# Patient Record
Sex: Male | Born: 1965
Health system: Southern US, Community
[De-identification: ages and names within clinical notes are randomized; demographics above are authoritative.]

## PROBLEM LIST (undated history)

## (undated) DIAGNOSIS — E785 Hyperlipidemia, unspecified: Secondary | ICD-10-CM

## (undated) HISTORY — DX: Hyperlipidemia, unspecified: E78.5

## (undated) HISTORY — PX: HERNIA REPAIR: SHX51

## (undated) HISTORY — PX: APPENDECTOMY: SHX54

---

## 1998-09-13 ENCOUNTER — Emergency Department (HOSPITAL_COMMUNITY): Admission: EM | Admit: 1998-09-13 | Discharge: 1998-09-13 | Payer: Self-pay | Admitting: Emergency Medicine

## 1998-09-13 ENCOUNTER — Encounter: Payer: Self-pay | Admitting: Emergency Medicine

## 2004-03-30 ENCOUNTER — Encounter: Admission: RE | Admit: 2004-03-30 | Discharge: 2004-03-30 | Payer: Self-pay | Admitting: General Surgery

## 2005-06-12 ENCOUNTER — Observation Stay (HOSPITAL_COMMUNITY): Admission: EM | Admit: 2005-06-12 | Discharge: 2005-06-13 | Payer: Self-pay | Admitting: Emergency Medicine

## 2005-06-12 ENCOUNTER — Encounter (INDEPENDENT_AMBULATORY_CARE_PROVIDER_SITE_OTHER): Payer: Self-pay | Admitting: Specialist

## 2005-06-19 ENCOUNTER — Emergency Department (HOSPITAL_COMMUNITY): Admission: EM | Admit: 2005-06-19 | Discharge: 2005-06-19 | Payer: Self-pay | Admitting: Emergency Medicine

## 2007-06-04 ENCOUNTER — Encounter: Admission: RE | Admit: 2007-06-04 | Discharge: 2007-06-04 | Payer: Self-pay | Admitting: Family Medicine

## 2007-06-10 ENCOUNTER — Encounter: Admission: RE | Admit: 2007-06-10 | Discharge: 2007-06-10 | Payer: Self-pay | Admitting: Family Medicine

## 2010-07-01 NOTE — H&P (Signed)
NAME:  Frank Rice, Frank Rice NO.:  [id number]   MEDICAL RECORD NO.:  [id number]          PATIENT TYPE:  INP   LOCATION:  1603                         FACILITY:  [facility]   PHYSICIAN:  Lebron Conners, M.D.   DATE OF BIRTH:  [birth year]   DATE OF ADMISSION:  06/12/2005  DATE OF DISCHARGE:                                HISTORY & PHYSICAL   CHIEF COMPLAINT:  This job was dictated twice.   HISTORY OF PRESENT ILLNESS:  The patient is a generally healthy, 45-year-  old, white male with a couple of days history of central abdominal pain  which became less severe and is localized in the right lower quadrant.  No  fever or elevation of the white count.  CT scan of the abdomen demonstrated  an enlarged appendix consistent with acute appendicitis.  The patient was  sent to the hospital for my attention and probable appendectomy.   PAST MEDICAL HISTORY:  1.  Bilateral inguinal hernia repair.  2.  Vasectomy.   MEDICATIONS:  He takes no medicines.   ALLERGIES:  No known medical allergies.   SOCIAL HISTORY:  He drinks alcoholic beverages in moderation.  He has  stopped smoking a few days prior.   FAMILY HISTORY:  Childhood illnesses unremarkable.   REVIEW OF SYSTEMS:  A 15-point review of systems unremarkable.   PHYSICAL EXAMINATION:  GENERAL:  In no acute distress.  Mental status  normal.  VITAL SIGNS:  Vital signs normal per nurse.  HEENT:  Unremarkable with no enlargement of thyroid.  No neck mass.  Normal  mucosa.  CHEST:  Clear to auscultation and percussion.  HEART:  Rate and rhythm normal.  No murmur or gallop.  ABDOMEN:  Generally soft, but a little bit tender in all four quadrants with  marked right lower quadrant tenderness.  No mass, no hernia.  Bilateral  inguinal scars.  GENITALIA:  Normal.  EXTREMITIES:  No edema.  Good pulses.  No skin lesions seen.  NEUROLOGICAL:  Grossly normal.   IMPRESSION:  Acute appendicitis.   PLAN:  Immediate appendectomy by  laparoscopic approach.  The patient  consents to the procedure.      Lebron Conners, M.D.  Electronically Signed     WB/MEDQ  D:  06/13/2005  T:  06/13/2005  Job:  [id number]

## 2010-07-01 NOTE — Op Note (Signed)
NAME:  MURRELL, DOME NO.:  [id number]   MEDICAL RECORD NO.:  [id number]          PATIENT TYPE:  INP   LOCATION:  6045                         FACILITY:  [facility]   PHYSICIAN:  Lebron Conners, M.D.   DATE OF BIRTH:  11-Dec-1965   DATE OF PROCEDURE:  06/12/2005  DATE OF DISCHARGE:                                 OPERATIVE REPORT   PRE AND POSTOPERATIVE DIAGNOSIS:  Acute appendicitis.   OPERATION:  Laparoscopic appendectomy.   SURGEON:  Lebron Conners, M.D.   ANESTHESIA:  General and local.   SPECIMEN:  Appendix.   BLOOD LOSS:  Minimal.   COMPLICATIONS:  None.   PROCEDURE:  After the patient was monitored and asleep and had a Foley  catheter and routine preparation and draping of the abdomen,  I made a 2 cm  vertical incision just below the umbilicus and incised the midline fascia,  then the posterior sheath since I was slightly to the right of the midline  and then bluntly opened the peritoneum.  I placed 0 Vicryl pursestring  suture and secured a Hassan cannula and inflated the abdomen with CO2.  I  put in the camera and had a good view and saw that the intestines looked  normal but the appendix was slightly swollen and inflamed.  I put in a 5 mm  port in the right upper quadrant and a 11 mm port in the lower midline a few  centimeters below the first port under direct view, noting no injuries of  viscera as they were inserted.  I then grasped the appendix and elevated it  and dissected the adhesions and fat away until I was able to staple across  it with the cutting endoscopic stapler.  It did not quite completely divide  all the attachment to the cecum so I stapled a second load and it appeared  to make a nice secure amputation of the appendix.  I placed the appendix in  a plastic pouch to be removed through the umbilical incision at the end of  the operation.  I irrigated the area briefly and removed the irrigant and  saw that hemostasis was excellent.   After removing the appendix from the  umbilical incision, I  tied the  pursestring suture then removed the right upper quadrant port under direct  view and saw no bleeding from the abdominal wall.  I then allowed the carbon  dioxide to escape and removed the hypogastric port as well.  I closed all  skin incisions with intracuticular 4-0 Vicryl and Steri-Strips.  The patient  tolerated the operation well.      Lebron Conners, M.D.  Electronically Signed     WB/MEDQ  D:  06/12/2005  T:  06/13/2005  Job:  [id number]   cc:   Brett Canales A. Cleta Alberts, M.D.  Fax: [phone]

## 2010-07-01 NOTE — H&P (Signed)
NAME:  Frank Rice, Frank Rice NO.:  [id number]   MEDICAL RECORD NO.:  [id number]          PATIENT TYPE:  INP   LOCATION:  1610                         FACILITY:  [facility]   PHYSICIAN:  Lebron Conners, M.D.   DATE OF BIRTH:  [birth year]   DATE OF ADMISSION:  06/12/2005  DATE OF DISCHARGE:                                HISTORY & PHYSICAL   CHIEF COMPLAINT:  Abdominal pain.   PRESENT ILLNESS:  The patient is a previously healthy, 45 year old, white  male with 2-day history of rather severe central and diffuse abdominal pain  which then became less severe, but localized to the right lower quadrant.  He was seen at by Dr. Cleta Alberts at [facility] and found to have no  fever, but marked right lower quadrant abdominal tenderness.  A CT scan was  done which was interpreted as consistent with acute appendicitis.  He is  brought to the hospital for appendectomy.   PAST MEDICAL HISTORY:  The patient's health is excellent.   MEDICATIONS:  He is on no medicines.   ALLERGIES:  No known drug allergies.   PAST SURGICAL HISTORY:  1.  Bilateral inguinal hernia repair.  2.  Vasectomy.  He had no problems with anesthesia.   SOCIAL HISTORY:  He smokes about half pack of cigarettes a day and is trying  to quit.  He drinks alcoholic beverages occasionally and moderately.   FAMILY HISTORY:  Childhood illnesses and 15-point review of systems are all  unremarkable.   PHYSICAL EXAMINATION:  VITAL SIGNS:  Temperature and vital signs per nursing  staff.  The mental status is normal.  HEENT:  Head, neck, eyes, ears, nose, mouth and throat are unremarkable with  no neck mass or thyromegaly.  CHEST:  Clear to auscultation.  HEART:  Regular rhythm normal.  No murmur or gallop appreciated.  ABDOMEN:  Soft with a few bowel sounds markedly tender with rebound  tenderness in the right lower quadrant.  No mass.  No hernia.  Bilateral  groin incisions which are well-healed.  GENITALIA:  Normal.  EXTREMITIES:  Normal.  No edema or lesions seen.  SKIN:  No lesions noted.  LYMPH NODES:  Lymph nodes not enlarged in the groin.   IMPRESSION:  Acute appendicitis.   PLAN:  Laparoscopic appendectomy immediately.      Lebron Conners, M.D.  Electronically Signed     WB/MEDQ  D:  06/12/2005  T:  06/12/2005  Job:  [id number]

## 2011-05-10 ENCOUNTER — Ambulatory Visit (INDEPENDENT_AMBULATORY_CARE_PROVIDER_SITE_OTHER): Payer: BC Managed Care – PPO | Admitting: Emergency Medicine

## 2011-05-10 DIAGNOSIS — H9190 Unspecified hearing loss, unspecified ear: Secondary | ICD-10-CM

## 2011-05-10 DIAGNOSIS — H6980 Other specified disorders of Eustachian tube, unspecified ear: Secondary | ICD-10-CM

## 2011-05-10 MED ORDER — FLUTICASONE PROPIONATE 50 MCG/ACT NA SUSP: 2.0000 | Freq: Every day | NASAL | Status: DC

## 2011-05-10 MED ORDER — AZELASTINE HCL 0.1 % NA SOLN: 2.0000 | Freq: Two times a day (BID) | NASAL | Status: DC

## 2011-05-10 MED ORDER — PREDNISONE 10 MG PO TABS: ORAL_TABLET | ORAL | Status: DC

## 2011-05-10 NOTE — Patient Instructions (Signed)
Recheck one week if hearing does not return.

## 2011-05-10 NOTE — Progress Notes (Signed)
  Subjective:    Patient ID: Frank Rice, male    DOB: [birth year], 46 y.o.   MRN: [id number]  HPI patient was in his usual state of health until yesterday when he noticed a significant amount of of nasal congestion. He then developed a closed sensation in his left ear not associated with tinnitus. He has noticed a significant loss of hearing in his left ear. He feels very congested    Review of Systems he has no history of allergies or ear related problems.     Objective:   Physical Exam  Constitutional: He appears well-developed and well-nourished.  HENT:  Head: Normocephalic.  Eyes: Pupils are equal, round, and reactive to light.  Neck: No JVD present. No tracheal deviation present. No thyromegaly present.  Cardiovascular: Normal rate and regular rhythm.   Pulmonary/Chest: No respiratory distress. He has no wheezes. He has no rales. He exhibits no tenderness.  Abdominal: He exhibits no distension and no mass. There is no tenderness. There is no rebound and no guarding.  Musculoskeletal: He exhibits no edema.  Lymphadenopathy:    He has no cervical adenopathy.  Neurological: He is alert.   Weber testing revealed diminished hearing in the left ear.  There is a 1 x 0.5 cm lipomatous feeling area beneath the left costal margin.     Assessment & Plan:   Assessment is nasal congestion with decreased hearing in the left ear. I'm not sure whether this is related to eustachian tube dysfunction or possible neurosensory hearing loss. We'll proceed with an audiogram

## 2011-05-29 ENCOUNTER — Ambulatory Visit (INDEPENDENT_AMBULATORY_CARE_PROVIDER_SITE_OTHER): Payer: BC Managed Care – PPO | Admitting: Family Medicine

## 2011-05-29 VITALS — BP 141/57 | HR 66 | Temp 98.1°F | Resp 16 | Ht 67.78 in | Wt 184.4 lb

## 2011-05-29 DIAGNOSIS — M549 Dorsalgia, unspecified: Secondary | ICD-10-CM

## 2011-05-29 LAB — POCT URINALYSIS DIPSTICK
Blood, UA: NEGATIVE
Protein, UA: NEGATIVE
Spec Grav, UA: 1.025
Urobilinogen, UA: 0.2
pH, UA: 5.5

## 2011-05-29 LAB — POCT UA - MICROSCOPIC ONLY
Bacteria, U Microscopic: NEGATIVE
Casts, Ur, LPF, POC: NEGATIVE
Crystals, Ur, HPF, POC: NEGATIVE
Mucus, UA: NEGATIVE

## 2011-05-29 MED ORDER — CYCLOBENZAPRINE HCL 10 MG PO TABS: 10.0000 mg | ORAL_TABLET | Freq: Three times a day (TID) | ORAL | Status: AC | PRN

## 2011-05-29 NOTE — Progress Notes (Signed)
  Subjective:    Patient ID: Frank Rice, male    DOB: 06/30/1965, 46 y.o.   MRN: [id number]  HPI 46 yo male with back pain for 4 days.  Started during the day.  Both sides.  Constant since.  ADvil and aleve help but having trouble sleeping.  Hurts to take deep breath.  Sharp.  No pain with urination.  No radiation.  NO fevers.  Feels well otherwise.    Review of Systems Negative except as per HPI     Objective:   Physical Exam  Constitutional: Vital signs are normal. He appears well-developed and well-nourished. He is active.  Non-toxic appearance. He does not appear ill.  Cardiovascular: Normal rate, regular rhythm, normal heart sounds and normal pulses.   Pulmonary/Chest: Effort normal and breath sounds normal.  Musculoskeletal:       Lumbar back: He exhibits no bony tenderness, no swelling and no deformity.       Back:       Arms: Neurological: He is alert. He has normal strength. Gait normal.  Reflex Scores:      Patellar reflexes are 2+ on the right side and 2+ on the left side.      Achilles reflexes are 2+ on the right side and 2+ on the left side. Upper lumbar/lower thoracic paraspinal musculature is location of pain.  No tenderness to palpation.  Good flexion, limited extension due to pain. Full strength.    Results for orders placed in visit on 05/29/11  POCT UA - MICROSCOPIC ONLY      Component Value Range   WBC, Ur, HPF, POC NEG     RBC, urine, microscopic NEG     Bacteria, U Microscopic NEG     Mucus, UA NEG     Epithelial cells, urine per micros 0-1     Crystals, Ur, HPF, POC NEG     Casts, Ur, LPF, POC NEG     Yeast, UA NEG    POCT URINALYSIS DIPSTICK      Component Value Range   Color, UA YELLOW     Clarity, UA CLEAR     Glucose, UA NEG     Bilirubin, UA NEG     Ketones, UA NEG     Spec Grav, UA 1.025     Blood, UA NEG     pH, UA 5.5     Protein, UA NEG     Urobilinogen, UA 0.2     Nitrite, UA NEG     Leukocytes, UA Negative            Assessment & Plan:  Back pain -  Negative urine.  Likely muscle strain.  Continue aleve.  Add flexeril.  Heat.

## 2011-06-08 ENCOUNTER — Telehealth: Payer: Self-pay | Admitting: Internal Medicine

## 2011-06-08 MED ORDER — FLUTICASONE PROPIONATE 50 MCG/ACT NA SUSP: 2.0000 | Freq: Every day | NASAL | Status: DC

## 2011-06-08 NOTE — Telephone Encounter (Signed)
flonase refilled for 6 months

## 2016-03-23 ENCOUNTER — Other Ambulatory Visit: Payer: Self-pay | Admitting: Family Medicine

## 2016-03-23 DIAGNOSIS — R102 Pelvic and perineal pain: Secondary | ICD-10-CM

## 2016-04-03 ENCOUNTER — Ambulatory Visit
Admission: RE | Admit: 2016-04-03 | Discharge: 2016-04-03 | Disposition: A | Discharge status: Home / Self Care | Payer: BLUE CROSS/BLUE SHIELD | Source: Ambulatory Visit | Attending: Family Medicine | Admitting: Family Medicine

## 2016-04-03 DIAGNOSIS — R102 Pelvic and perineal pain: Secondary | ICD-10-CM

## 2016-04-03 MED ORDER — IOPAMIDOL (ISOVUE-M 200) INJECTION 41%
1.0000 mL | Freq: Once | INTRAMUSCULAR | Status: AC
  Administered 2016-04-03: 1 mL

## 2016-04-03 MED ORDER — METHYLPREDNISOLONE ACETATE 40 MG/ML INJ SUSP (RADIOLOG
120.0000 mg | Freq: Once | INTRAMUSCULAR | Status: AC
  Administered 2016-04-03: 120 mg via INTRAMUSCULAR

## 2017-06-11 ENCOUNTER — Other Ambulatory Visit: Payer: Self-pay | Admitting: Family Medicine

## 2017-06-11 DIAGNOSIS — R102 Pelvic and perineal pain: Secondary | ICD-10-CM

## 2017-07-02 ENCOUNTER — Ambulatory Visit
Admission: RE | Admit: 2017-07-02 | Discharge: 2017-07-02 | Disposition: A | Discharge status: Home / Self Care | Payer: BLUE CROSS/BLUE SHIELD | Source: Ambulatory Visit | Attending: Family Medicine | Admitting: Family Medicine

## 2017-07-02 DIAGNOSIS — R102 Pelvic and perineal pain: Secondary | ICD-10-CM

## 2017-07-02 MED ORDER — IOPAMIDOL (ISOVUE-M 200) INJECTION 41%
1.0000 mL | Freq: Once | INTRAMUSCULAR | Status: AC
  Administered 2017-07-02: 1 mL

## 2017-07-02 MED ORDER — METHYLPREDNISOLONE ACETATE 40 MG/ML INJ SUSP (RADIOLOG
120.0000 mg | Freq: Once | INTRAMUSCULAR | Status: AC
  Administered 2017-07-02: 120 mg via INTRAMUSCULAR

## 2017-07-10 ENCOUNTER — Ambulatory Visit
Admission: RE | Admit: 2017-07-10 | Discharge: 2017-07-10 | Disposition: A | Discharge status: Home / Self Care | Payer: BLUE CROSS/BLUE SHIELD | Source: Ambulatory Visit | Attending: Family Medicine | Admitting: Family Medicine

## 2017-07-10 ENCOUNTER — Other Ambulatory Visit: Payer: Self-pay | Admitting: Family Medicine

## 2017-07-10 DIAGNOSIS — T1490XA Injury, unspecified, initial encounter: Secondary | ICD-10-CM

## 2018-05-27 DIAGNOSIS — S73199A Other sprain of unspecified hip, initial encounter: Secondary | ICD-10-CM | POA: Diagnosis not present

## 2018-05-27 DIAGNOSIS — M7062 Trochanteric bursitis, left hip: Secondary | ICD-10-CM | POA: Diagnosis not present

## 2018-05-28 DIAGNOSIS — E782 Mixed hyperlipidemia: Secondary | ICD-10-CM | POA: Diagnosis not present

## 2018-07-23 ENCOUNTER — Ambulatory Visit: Payer: Self-pay

## 2018-07-23 ENCOUNTER — Ambulatory Visit (INDEPENDENT_AMBULATORY_CARE_PROVIDER_SITE_OTHER): Payer: BC Managed Care – PPO | Admitting: Physical Medicine and Rehabilitation

## 2018-07-23 ENCOUNTER — Encounter: Payer: Self-pay | Admitting: Physical Medicine and Rehabilitation

## 2018-07-23 ENCOUNTER — Other Ambulatory Visit: Payer: Self-pay

## 2018-07-23 DIAGNOSIS — M7072 Other bursitis of hip, left hip: Secondary | ICD-10-CM

## 2018-07-23 DIAGNOSIS — M7062 Trochanteric bursitis, left hip: Secondary | ICD-10-CM

## 2018-07-23 DIAGNOSIS — M25552 Pain in left hip: Secondary | ICD-10-CM | POA: Diagnosis not present

## 2018-07-23 NOTE — Progress Notes (Signed)
Frank Rice - 53 y.o. male MRN [id number]  Date of birth: May 25, 1965  Office Visit Note: Visit Date: 07/23/2018 PCP: Derinda Late, MD Referred by: Derinda Late, MD  Subjective: Chief Complaint  Patient presents with  . Left Hip - Pain   HPI:  Frank Rice is a 53 y.o. male who comes in today At the request of Derinda Late, MD for evaluation management of left hip pain.  Patient reports about a 2 to 3-year history of left posterior buttock pain and at least on one occasion several years ago pain more to the side near the greater trochanter.  He reports initial pain over the greater trochanter was treated conservatively and he has had some physical therapy in the past and ultimately had an injection there that really has done well up until just recently.  In terms of his left buttock pain it is at the insertion of the proximal hamstring insertion over the issue him.  He really does point right in that area.  He has had 2 prior diagnostic and therapeutic injections at [place].  We actually have those images to review and those do look well-placed.  He said the last injection last year did extremely well.  He has no radicular pain or focal weakness or paresthesia.  He reports initial injury was leaning over a boat and when he tried to get up he felt a tear or a pool on both sides but the right side he feels has healed and is never really had any issues since then but the left side has been nagging him.  While he only averages a 3 out of 10 pain it is very symptomatic in terms of functional issues and he reports some functional loss when it is bothering him.  He is not had any work-up of his lumbar spine and he had an old MRI of the left hip but this was prior to any of the injury.  He really is asking today what to do about continued flareups of the bursitis.  Review of Systems  Constitutional: Negative for chills, fever, malaise/fatigue and weight loss.  HENT: Negative for  hearing loss and sinus pain.   Eyes: Negative for blurred vision, double vision and photophobia.  Respiratory: Negative for cough and shortness of breath.   Cardiovascular: Negative for chest pain, palpitations and leg swelling.  Gastrointestinal: Negative for abdominal pain, nausea and vomiting.  Genitourinary: Negative for flank pain.  Musculoskeletal: Positive for back pain and joint pain. Negative for myalgias.       Pain over the left ischium and greater trochanter  Skin: Negative for itching and rash.  Neurological: Negative for tremors, focal weakness and weakness.  Endo/Heme/Allergies: Negative.   Psychiatric/Behavioral: Negative for depression.  All other systems reviewed and are negative.  Otherwise per HPI.  Assessment & Plan: Visit Diagnoses:  1. Ischial bursitis of left side   2. Greater trochanteric bursitis, left   3. Pain in left hip     Plan: Findings:  Chronic history of what is felt to be left ischial bursitis as well as at least on one occasion greater trochanteric bursitis.  He does have pain over those 2 areas today that is consistent with that finding.  We do believe now that these are more pain syndromes and not necessarily a bursitis per se.  In terms of him asking about what to do about these long-term I think the best approach is a very good physical therapist not for  a long course but for somebody to see if there is something with his posture or joint flexibility or tightness that could be the source of continued flaring up of this.  We do see this as we get older that we do get more instances of this "bursitis or tendinitis.  The pain on the left greater trochanter could be gluteus medius tendinitis as well instead of just a trochanteric bursitis.  We are going to complete both injections today with fluoroscopic guidance and see if he gets him some relief diagnostically and therapeutically.  I would asked Dr. Duaine DredgeBlomgren to consider course of physical therapy somewhere  like [place] [facility] or similar who can really give him a good evaluation for exercises and potentially strengthening exercises including sometimes eccentric type activities.  He might also benefit from repeat MRI of the pelvis and hip just to see if there is any more acute changes there although I do not think that is warranted but the patient did ask about that.    Meds & Orders: No orders of the defined types were placed in this encounter.   Orders Placed This Encounter  Procedures  . Large Joint Inj  . Large Joint Inj: L greater trochanter  . XR C-ARM NO REPORT    Follow-up: Return for Mosetta PuttPeter Blomgren, MD.   Procedures: Large Joint Inj (Left Ischial Bursa) on 07/23/2018 8:37 AM Indications: pain and diagnostic evaluation Details: 22 G 3.5 in needle, fluoroscopy-guided lateral approach  Arthrogram: No  Medications: 4 mL bupivacaine 0.25 %; 80 mg triamcinolone acetonide 40 MG/ML Outcome: tolerated well, no immediate complications  There was excellent flow of contrast outlined the left ischial bursa without vascular uptake. Procedure, treatment alternatives, risks and benefits explained, specific risks discussed. Consent was given by the patient. Immediately prior to procedure a time out was called to verify the correct patient, procedure, equipment, support staff and site/side marked as required. Patient was prepped and draped in the usual sterile fashion.   Large Joint Inj: L greater trochanter on 07/24/2018 6:44 AM Indications: pain and diagnostic evaluation Details: 22 G 3.5 in needle, fluoroscopy-guided lateral approach  Arthrogram: No  Medications: 4 mL lidocaine 2 %; 80 mg triamcinolone acetonide 40 MG/ML; 4 mL bupivacaine 0.25 % Outcome: tolerated well, no immediate complications  There was excellent flow of contrast outlined the greater trochanteric bursa without vascular uptake. Procedure, treatment alternatives, risks and benefits explained, specific risks  discussed. Consent was given by the patient. Immediately prior to procedure a time out was called to verify the correct patient, procedure, equipment, support staff and site/side marked as required. Patient was prepped and draped in the usual sterile fashion.      No notes on file   Clinical History: No specialty comments available.     Objective:  VS:  HT:    WT:   BMI:     BP:   HR: bpm  TEMP: ( )  RESP:  Physical Exam Vitals signs and nursing note reviewed.  Constitutional:      General: He is not in acute distress.    Appearance: Normal appearance. He is well-developed.  HENT:     Head: Normocephalic and atraumatic.  Eyes:     Conjunctiva/sclera: Conjunctivae normal.     Pupils: Pupils are equal, round, and reactive to light.  Neck:     Musculoskeletal: Normal range of motion and neck supple. No neck rigidity.  Cardiovascular:     Rate and Rhythm: Normal rate.  Pulses: Normal pulses.     Heart sounds: Normal heart sounds.  Pulmonary:     Effort: Pulmonary effort is normal. No respiratory distress.  Musculoskeletal:     Right lower leg: No edema.     Left lower leg: No edema.     Comments: Patient ambulates with a forward flexed lumbar spine with some pain on extension of the lumbar spine.  He clearly has pain over the left ischium and left greater trochanteric area although it is a little bit more posterior than that.  He has no groin pain with hip rotation he has good distal strength.  Skin:    General: Skin is warm and dry.     Findings: No erythema or rash.  Neurological:     General: No focal deficit present.     Mental Status: He is alert and oriented to person, place, and time.     Sensory: No sensory deficit.     Coordination: Coordination normal.     Gait: Gait normal.  Psychiatric:        Mood and Affect: Mood normal.        Behavior: Behavior normal.     Ortho Exam Imaging: Xr C-arm No Report  Result Date: 07/23/2018 Please see Notes tab for  imaging impression.   Past Medical/Family/Surgical/Social History: Medications & Allergies reviewed per EMR, new medications updated. There are no active problems to display for this patient.  History reviewed. No pertinent past medical history. History reviewed. No pertinent family history. History reviewed. No pertinent surgical history. Social History   Occupational History  . Not on file  Tobacco Use  . Smoking status: Former Games developermoker  . Smokeless tobacco: Never Used  Substance and Sexual Activity  . Alcohol use: Not on file  . Drug use: Not on file  . Sexual activity: Not on file

## 2018-07-23 NOTE — Progress Notes (Signed)
   Numeric Pain Rating Scale and Functional Assessment Average Pain 3    In the last MONTH (on 0-10 scale) has pain interfered with the following?  1. General activity like being  able to carry out your everyday physical activities such as walking, climbing stairs, carrying groceries, or moving a chair?  Rating(8)   +Driver, -BT, -Dye Allergies.  

## 2018-07-24 ENCOUNTER — Encounter: Payer: Self-pay | Admitting: Physical Medicine and Rehabilitation

## 2018-07-24 DIAGNOSIS — M7072 Other bursitis of hip, left hip: Secondary | ICD-10-CM | POA: Diagnosis not present

## 2018-07-24 DIAGNOSIS — M7062 Trochanteric bursitis, left hip: Secondary | ICD-10-CM | POA: Diagnosis not present

## 2018-07-24 MED ORDER — BUPIVACAINE HCL 0.25 % IJ SOLN
4.0000 mL | INTRAMUSCULAR | Status: AC | PRN
  Administered 2018-07-23: 4 mL via INTRA_ARTICULAR

## 2018-07-24 MED ORDER — TRIAMCINOLONE ACETONIDE 40 MG/ML IJ SUSP
80.0000 mg | INTRAMUSCULAR | Status: AC | PRN
  Administered 2018-07-23: 09:00:00 80 mg via INTRA_ARTICULAR

## 2018-07-24 MED ORDER — TRIAMCINOLONE ACETONIDE 40 MG/ML IJ SUSP
80.0000 mg | INTRAMUSCULAR | Status: AC | PRN
  Administered 2018-07-24: 07:00:00 80 mg via INTRA_ARTICULAR

## 2018-07-24 MED ORDER — BUPIVACAINE HCL 0.25 % IJ SOLN
4.0000 mL | INTRAMUSCULAR | Status: AC | PRN
  Administered 2018-07-24: 4 mL via INTRA_ARTICULAR

## 2018-07-24 MED ORDER — LIDOCAINE HCL 2 % IJ SOLN
4.0000 mL | INTRAMUSCULAR | Status: AC | PRN
  Administered 2018-07-24: 07:00:00 4 mL

## 2018-09-17 DIAGNOSIS — E782 Mixed hyperlipidemia: Secondary | ICD-10-CM | POA: Diagnosis not present

## 2018-11-18 DIAGNOSIS — M25552 Pain in left hip: Secondary | ICD-10-CM | POA: Diagnosis not present

## 2018-11-18 DIAGNOSIS — R300 Dysuria: Secondary | ICD-10-CM | POA: Diagnosis not present

## 2018-11-18 DIAGNOSIS — Z23 Encounter for immunization: Secondary | ICD-10-CM | POA: Diagnosis not present

## 2018-11-22 ENCOUNTER — Other Ambulatory Visit: Payer: Self-pay | Admitting: Family Medicine

## 2018-11-22 DIAGNOSIS — M25552 Pain in left hip: Secondary | ICD-10-CM

## 2018-12-13 ENCOUNTER — Ambulatory Visit
Admission: RE | Admit: 2018-12-13 | Discharge: 2018-12-13 | Disposition: A | Discharge status: Home / Self Care | Payer: BC Managed Care – PPO | Source: Ambulatory Visit | Attending: Family Medicine | Admitting: Family Medicine

## 2018-12-13 ENCOUNTER — Other Ambulatory Visit: Payer: Self-pay

## 2018-12-13 DIAGNOSIS — M25552 Pain in left hip: Secondary | ICD-10-CM

## 2018-12-19 DIAGNOSIS — E782 Mixed hyperlipidemia: Secondary | ICD-10-CM | POA: Diagnosis not present

## 2018-12-25 DIAGNOSIS — L723 Sebaceous cyst: Secondary | ICD-10-CM | POA: Diagnosis not present

## 2018-12-25 DIAGNOSIS — Z85828 Personal history of other malignant neoplasm of skin: Secondary | ICD-10-CM | POA: Diagnosis not present

## 2018-12-25 DIAGNOSIS — L821 Other seborrheic keratosis: Secondary | ICD-10-CM | POA: Diagnosis not present

## 2018-12-25 DIAGNOSIS — D2262 Melanocytic nevi of left upper limb, including shoulder: Secondary | ICD-10-CM | POA: Diagnosis not present

## 2018-12-25 DIAGNOSIS — L918 Other hypertrophic disorders of the skin: Secondary | ICD-10-CM | POA: Diagnosis not present

## 2019-01-06 ENCOUNTER — Ambulatory Visit: Payer: Self-pay

## 2019-01-06 ENCOUNTER — Other Ambulatory Visit: Payer: Self-pay

## 2019-01-06 ENCOUNTER — Encounter: Payer: Self-pay | Admitting: Physical Medicine and Rehabilitation

## 2019-01-06 ENCOUNTER — Ambulatory Visit (INDEPENDENT_AMBULATORY_CARE_PROVIDER_SITE_OTHER): Payer: BC Managed Care – PPO | Admitting: Physical Medicine and Rehabilitation

## 2019-01-06 DIAGNOSIS — M25552 Pain in left hip: Secondary | ICD-10-CM | POA: Diagnosis not present

## 2019-01-06 DIAGNOSIS — M7062 Trochanteric bursitis, left hip: Secondary | ICD-10-CM | POA: Diagnosis not present

## 2019-01-06 DIAGNOSIS — M7072 Other bursitis of hip, left hip: Secondary | ICD-10-CM | POA: Diagnosis not present

## 2019-01-06 NOTE — Progress Notes (Signed)
 .  Numeric Pain Rating Scale and Functional Assessment Average Pain 3   In the last MONTH (on 0-10 scale) has pain interfered with the following?  1. General activity like being  able to carry out your everyday physical activities such as walking, climbing stairs, carrying groceries, or moving a chair?  Rating(8)   -Dye Allergies.

## 2019-01-07 MED ORDER — TRIAMCINOLONE ACETONIDE 40 MG/ML IJ SUSP
60.0000 mg | INTRAMUSCULAR | Status: AC | PRN
  Administered 2019-01-07: 60 mg via INTRA_ARTICULAR

## 2019-01-07 MED ORDER — BUPIVACAINE HCL 0.25 % IJ SOLN
4.0000 mL | INTRAMUSCULAR | Status: AC | PRN
Start: 2019-01-07 — End: 2019-01-07
  Administered 2019-01-07: 4 mL via INTRA_ARTICULAR

## 2019-01-07 MED ORDER — LIDOCAINE HCL 2 % IJ SOLN
4.0000 mL | INTRAMUSCULAR | Status: AC | PRN
  Administered 2019-01-07: 4 mL

## 2019-01-07 MED ORDER — BUPIVACAINE HCL 0.25 % IJ SOLN
4.0000 mL | INTRAMUSCULAR | Status: AC | PRN
  Administered 2019-01-07: 06:00:00 4 mL via INTRA_ARTICULAR

## 2019-01-07 NOTE — Progress Notes (Signed)
Frank Rice - 53 y.o. male MRN [id number]  Date of birth: Jul 02, 1965  Office Visit Note: Visit Date: 01/06/2019 PCP: Mosetta Putt, MD Referred by: Mosetta Putt, MD  Subjective: Chief Complaint  Patient presents with  . Left Hip - Pain   HPI:  Frank Rice is a 53 y.o. male who comes in today For planned repeat of left ischial bursa injection on the left greater trochanteric injection both using fluoroscopic guidance.  Patient's history is such that we completed same injection in June and he got really good relief for 4 to 5 months and the symptoms returned.  He is actually had this before and has had similar injection at [facility] imaging.  He is followed closely by his primary care physician Dr. Mosetta Putt.  He has had MRI of his hip since have seen him and this shows mild tendinosis of the gluteus medius tendon as well as calcific area at the insertion on the greater trochanter.  There were no other signs of tendinitis or edema around the hip.  Obviously the internal part of the hip were limited due to no contrast use.  He has no pain of the groin area no pain with hip rotation.  He clearly has these tendinopathy issues or what someone call enthesopathy.  We are going to repeat the injection today since he did so well.  Has had no new issues or trauma or falls.  Consideration given to prolotherapy or some other proliferative treatment.  ROS Otherwise per HPI.  Assessment & Plan: Visit Diagnoses:  1. Ischial bursitis of left side   2. Greater trochanteric bursitis, left   3. Pain in left hip     Plan: No additional findings.   Meds & Orders: No orders of the defined types were placed in this encounter.   Orders Placed This Encounter  Procedures  . Large Joint Inj  . Large Joint Inj  . C-ARM NO Order    Follow-up: No follow-ups on file.   Procedures: Ischial bursa injection fluoroscopic guidance (Left ischial bursa) on 01/07/2019 5:47 AM Indications: diagnostic  evaluation and pain Details: 22 G 3.5 in needle, fluoroscopy-guided posterior approach  Arthrogram: No  Medications: 4 mL bupivacaine 0.25 %; 60 mg triamcinolone acetonide 40 MG/ML Outcome: tolerated well, no immediate complications  There was excellent flow of contrast producing a partial arthrogram of the hip. The patient did have relief of symptoms during the anesthetic phase of the injection. Procedure, treatment alternatives, risks and benefits explained, specific risks discussed. Consent was given by the patient. Immediately prior to procedure a time out was called to verify the correct patient, procedure, equipment, support staff and site/side marked as required. Patient was prepped and draped in the usual sterile fashion.   Left greater trochanteric bursa injection on 01/07/2019 5:48 AM Indications: pain and diagnostic evaluation Details: 22 G 3.5 in needle, fluoroscopy-guided lateral approach  Arthrogram: No  Medications: 4 mL lidocaine 2 %; 4 mL bupivacaine 0.25 %; 60 mg triamcinolone acetonide 40 MG/ML Outcome: tolerated well, no immediate complications  There was excellent flow of contrast outlined the greater trochanteric bursa without vascular uptake. Procedure, treatment alternatives, risks and benefits explained, specific risks discussed. Consent was given by the patient. Immediately prior to procedure a time out was called to verify the correct patient, procedure, equipment, support staff and site/side marked as required. Patient was prepped and draped in the usual sterile fashion.      No notes on file   Clinical  History: MR OF THE LEFT HIP WITHOUT CONTRAST  TECHNIQUE: Multiplanar, multisequence MR imaging was performed. No intravenous contrast was administered.  COMPARISON:  None.  FINDINGS: Bones: No hip fracture, dislocation or avascular necrosis. No periosteal reaction or bone destruction. No aggressive osseous lesion.  Normal sacrum and sacroiliac  joints. No SI joint widening or erosive changes.  Articular cartilage and labrum  Articular cartilage:  No chondral defect.  Labrum: Grossly intact, but evaluation is limited by lack of intraarticular fluid.  Joint or bursal effusion  Joint effusion:  No hip joint effusion.  No SI joint effusion.  Bursae:  No bursa formation.  Muscles and tendons  Flexors: Normal.  Extensors: Normal.  Abductors: Normal.  Adductors: Normal.  Gluteals: Mild tendinosis of the left gluteus medius tendon insertion with a small area of calcification adjacent to the greater trochanter which may reflect calcific tendinosis.  Hamstrings: Normal.  Other findings  Miscellaneous: No pelvic free fluid. No fluid collection or hematoma. No inguinal lymphadenopathy. No inguinal hernia.  IMPRESSION: 1. Mild tendinosis of the left gluteus medius tendon insertion with a small area of calcification adjacent to the greater trochanter which may reflect calcific tendinosis with a small partial tear. 2. No hip fracture, dislocation or avascular necrosis.   Electronically Signed   By: Kathreen Devoid   On: 12/13/2018 14:20     Objective:  VS:  HT:    WT:   BMI:     BP:   HR: bpm  TEMP: ( )  RESP:  Physical Exam Musculoskeletal:     Comments: Patient ambulates without aid with good distal strength.  He has pain over the left greater trochanter not the right.  He has tenderness to deep palpation along the left upper hamstring with no defects noted.     Ortho Exam Imaging: C-arm No Order  Result Date: 01/06/2019 Please see Notes tab for imaging impression.

## 2019-05-06 DIAGNOSIS — Z03818 Encounter for observation for suspected exposure to other biological agents ruled out: Secondary | ICD-10-CM | POA: Diagnosis not present

## 2019-05-06 DIAGNOSIS — Z20828 Contact with and (suspected) exposure to other viral communicable diseases: Secondary | ICD-10-CM | POA: Diagnosis not present

## 2019-06-23 ENCOUNTER — Other Ambulatory Visit: Payer: Self-pay | Admitting: Family Medicine

## 2019-06-23 ENCOUNTER — Other Ambulatory Visit: Payer: Self-pay

## 2019-06-23 ENCOUNTER — Ambulatory Visit
Admission: RE | Admit: 2019-06-23 | Discharge: 2019-06-23 | Disposition: A | Discharge status: Home / Self Care | Payer: BC Managed Care – PPO | Source: Ambulatory Visit | Attending: Family Medicine | Admitting: Family Medicine

## 2019-06-23 DIAGNOSIS — M5440 Lumbago with sciatica, unspecified side: Secondary | ICD-10-CM

## 2019-06-23 DIAGNOSIS — M545 Low back pain: Secondary | ICD-10-CM | POA: Diagnosis not present

## 2019-09-29 DIAGNOSIS — R300 Dysuria: Secondary | ICD-10-CM | POA: Diagnosis not present

## 2019-10-06 ENCOUNTER — Telehealth: Payer: Self-pay | Admitting: Physical Medicine and Rehabilitation

## 2019-10-06 NOTE — Telephone Encounter (Signed)
Received referral from Dr. Duaine Dredge via email. Patient had left ischial bursa injection on 01/06/2019 and 07/23/2018. Ok to repeat?

## 2019-10-06 NOTE — Telephone Encounter (Signed)
Yes 15 min, if he knows it is a repeat

## 2019-10-06 NOTE — Telephone Encounter (Signed)
Scheduled for 8/31 at  1445.

## 2019-10-14 ENCOUNTER — Other Ambulatory Visit: Payer: Self-pay

## 2019-10-14 ENCOUNTER — Ambulatory Visit: Payer: Self-pay

## 2019-10-14 ENCOUNTER — Encounter: Payer: Self-pay | Admitting: Physical Medicine and Rehabilitation

## 2019-10-14 ENCOUNTER — Ambulatory Visit (INDEPENDENT_AMBULATORY_CARE_PROVIDER_SITE_OTHER): Payer: BC Managed Care – PPO | Admitting: Physical Medicine and Rehabilitation

## 2019-10-14 DIAGNOSIS — M7062 Trochanteric bursitis, left hip: Secondary | ICD-10-CM | POA: Diagnosis not present

## 2019-10-14 DIAGNOSIS — M25552 Pain in left hip: Secondary | ICD-10-CM

## 2019-10-14 DIAGNOSIS — M7072 Other bursitis of hip, left hip: Secondary | ICD-10-CM | POA: Diagnosis not present

## 2019-10-14 NOTE — Progress Notes (Signed)
Needs PT Left GT and ischial Pt states left hip and hamstring pain. Pt states sitting, walking for a long time makes the pain worse. Pt has hx of inj on 01/06/19 it was good, pt state the pain returned in May 2021.   Numeric Pain Rating Scale and Functional Assessment Average Pain 4   In the last MONTH (on 0-10 scale) has pain interfered with the following?  1. General activity like being  able to carry out your everyday physical activities such as walking, climbing stairs, carrying groceries, or moving a chair?  Rating(8)   +Driver, -BT, -Dye Allergies.

## 2019-10-14 NOTE — Progress Notes (Signed)
Frank Rice - 54 y.o. male MRN [id number]  Date of birth: 1965-12-30  Office Visit Note: Visit Date: 10/14/2019 PCP: Mosetta Putt, MD Referred by: Mosetta Putt, MD  Subjective: Chief Complaint  Patient presents with  . Left Hip - Pain   HPI: Frank Rice is a 54 y.o. male who comes in today for planned repeat Left ischial bursa injection and greater trochanter injection with fluoroscopic guidance.  The patient has failed conservative care including home exercise, medications, time and activity modification. Prior injection gave more than 50% relief for several months. This injection will be diagnostic and hopefully therapeutic.  Please see requesting physician notes for further details and justification.  Patient has had the same injection performed 2 times in 2020 with the last being in November and he got excellent relief despite failure of care with therapy and medication management and prior injection in the office by his primary physician.  Referring: Mosetta Putt, MD   ROS Otherwise per HPI.  Assessment & Plan: Visit Diagnoses:  1. Ischial bursitis of left side   2. Greater trochanteric bursitis, left   3. Pain in left hip     Plan: No additional findings.   Meds & Orders: No orders of the defined types were placed in this encounter.   Orders Placed This Encounter  Procedures  . Large Joint Inj  . Large Joint Inj  . XR C-ARM NO REPORT  . Ambulatory referral to Physical Therapy    Follow-up: Return if symptoms worsen or fail to improve.   Procedures: Large Joint Inj (Left Ischial Bursa) on 10/14/2019 2:56 PM Indications: diagnostic evaluation and pain Details: 22 G 3.5 in needle, fluoroscopy-guided posterior approach  Arthrogram: No  Medications: 4 mL bupivacaine 0.25 %; 60 mg triamcinolone acetonide 40 MG/ML Outcome: tolerated well, no immediate complications  There was excellent flow of contrast producing a partial bursa gram of the left ischial  bursa. Procedure, treatment alternatives, risks and benefits explained, specific risks discussed. Consent was given by the patient. Immediately prior to procedure a time out was called to verify the correct patient, procedure, equipment, support staff and site/side marked as required. Patient was prepped and draped in the usual sterile fashion.   Large Joint Inj on 10/14/2019 2:45 PM Indications: pain and diagnostic evaluation Details: 22 G 3.5 in needle, fluoroscopy-guided lateral approach  Arthrogram: No  Medications: 4 mL lidocaine 2 %; 4 mL bupivacaine 0.25 %; 60 mg triamcinolone acetonide 40 MG/ML Outcome: tolerated well, no immediate complications  There was excellent flow of contrast outlined the greater trochanteric bursa without vascular uptake. Procedure, treatment alternatives, risks and benefits explained, specific risks discussed. Consent was given by the patient. Immediately prior to procedure a time out was called to verify the correct patient, procedure, equipment, support staff and site/side marked as required. Patient was prepped and draped in the usual sterile fashion.      No notes on file   Clinical History: MR OF THE LEFT HIP WITHOUT CONTRAST  TECHNIQUE: Multiplanar, multisequence MR imaging was performed. No intravenous contrast was administered.  COMPARISON:  None.  FINDINGS: Bones: No hip fracture, dislocation or avascular necrosis. No periosteal reaction or bone destruction. No aggressive osseous lesion.  Normal sacrum and sacroiliac joints. No SI joint widening or erosive changes.  Articular cartilage and labrum  Articular cartilage:  No chondral defect.  Labrum: Grossly intact, but evaluation is limited by lack of intraarticular fluid.  Joint or bursal effusion  Joint effusion:  No  hip joint effusion.  No SI joint effusion.  Bursae:  No bursa formation.  Muscles and tendons  Flexors: Normal.  Extensors:  Normal.  Abductors: Normal.  Adductors: Normal.  Gluteals: Mild tendinosis of the left gluteus medius tendon insertion with a small area of calcification adjacent to the greater trochanter which may reflect calcific tendinosis.  Hamstrings: Normal.  Other findings  Miscellaneous: No pelvic free fluid. No fluid collection or hematoma. No inguinal lymphadenopathy. No inguinal hernia.  IMPRESSION: 1. Mild tendinosis of the left gluteus medius tendon insertion with a small area of calcification adjacent to the greater trochanter which may reflect calcific tendinosis with a small partial tear. 2. No hip fracture, dislocation or avascular necrosis.   Electronically Signed   By: Elige Ko   On: 12/13/2018 14:20   He reports that he has quit smoking. He has never used smokeless tobacco. No results for input(s): HGBA1C, LABURIC in the last 8760 hours.  Objective:  VS:  HT:    WT:   BMI:     BP:   HR: bpm  TEMP: ( )  RESP:  Physical Exam Constitutional:      General: He is not in acute distress.    Appearance: Normal appearance. He is not ill-appearing.  HENT:     Head: Normocephalic and atraumatic.     Right Ear: External ear normal.     Left Ear: External ear normal.  Eyes:     Extraocular Movements: Extraocular movements intact.  Cardiovascular:     Rate and Rhythm: Normal rate.     Pulses: Normal pulses.  Abdominal:     General: There is no distension.     Palpations: Abdomen is soft.  Musculoskeletal:        General: No tenderness or signs of injury.     Right lower leg: No edema.     Left lower leg: No edema.     Comments: Patient has good distal strength without clonus.  He has pain over the left greater trochanter and left posterior hamstring insertion at the ischium  Skin:    Findings: No erythema or rash.  Neurological:     General: No focal deficit present.     Mental Status: He is alert and oriented to person, place, and time.     Sensory:  No sensory deficit.     Motor: No weakness or abnormal muscle tone.     Coordination: Coordination normal.  Psychiatric:        Mood and Affect: Mood normal.        Behavior: Behavior normal.     Ortho Exam  Imaging: No results found.  Past Medical/Family/Surgical/Social History: Medications & Allergies reviewed per EMR, new medications updated. Patient Active Problem List   Diagnosis Date Noted  . COVID-19 virus infection 11/04/2019   History reviewed. No pertinent past medical history. History reviewed. No pertinent family history. History reviewed. No pertinent surgical history. Social History   Occupational History  . Not on file  Tobacco Use  . Smoking status: Former Games developer  . Smokeless tobacco: Never Used  Substance and Sexual Activity  . Alcohol use: Not on file  . Drug use: Not on file  . Sexual activity: Not on file

## 2019-10-27 DIAGNOSIS — Z03818 Encounter for observation for suspected exposure to other biological agents ruled out: Secondary | ICD-10-CM | POA: Diagnosis not present

## 2019-11-04 ENCOUNTER — Telehealth: Payer: Self-pay | Admitting: Nurse Practitioner

## 2019-11-04 ENCOUNTER — Ambulatory Visit: Payer: BC Managed Care – PPO | Admitting: Physical Therapy

## 2019-11-04 DIAGNOSIS — U071 COVID-19: Secondary | ICD-10-CM

## 2019-11-04 DIAGNOSIS — Z1159 Encounter for screening for other viral diseases: Secondary | ICD-10-CM | POA: Diagnosis not present

## 2019-11-04 NOTE — Telephone Encounter (Signed)
Called to discuss with Frank Rice about Covid symptoms and the use of regeneron, a monoclonal antibody infusion for those with mild to moderate Covid symptoms and at a high risk of hospitalization.     Pt does not qualify for infusion therapy given symptoms first presented > 10 days prior to timing of infusion. Symptoms tier reviewed as well as criteria for ending isolation. Preventative practices reviewed. Patient verbalized understanding. He is followed by telemedicine.   There are no problems to display for this patient.  Frank Masse, NP [facility]  [phone] [name]

## 2019-11-09 MED ORDER — BUPIVACAINE HCL 0.25 % IJ SOLN
4.0000 mL | INTRAMUSCULAR | Status: AC | PRN
  Administered 2019-10-14: 4 mL via INTRA_ARTICULAR

## 2019-11-09 MED ORDER — LIDOCAINE HCL 2 % IJ SOLN
4.0000 mL | INTRAMUSCULAR | Status: AC | PRN
  Administered 2019-10-14: 4 mL

## 2019-11-09 MED ORDER — TRIAMCINOLONE ACETONIDE 40 MG/ML IJ SUSP
60.0000 mg | INTRAMUSCULAR | Status: AC | PRN
  Administered 2019-10-14: 60 mg via INTRA_ARTICULAR

## 2019-11-09 MED ORDER — TRIAMCINOLONE ACETONIDE 40 MG/ML IJ SUSP
60.0000 mg | INTRAMUSCULAR | Status: AC | PRN
Start: 2019-10-14 — End: 2019-10-14
  Administered 2019-10-14: 60 mg via INTRA_ARTICULAR

## 2019-11-09 MED ORDER — BUPIVACAINE HCL 0.25 % IJ SOLN
4.0000 mL | INTRAMUSCULAR | Status: AC | PRN
Start: 2019-10-14 — End: 2019-10-14
  Administered 2019-10-14: 4 mL via INTRA_ARTICULAR

## 2019-11-20 ENCOUNTER — Ambulatory Visit: Payer: BC Managed Care – PPO | Admitting: Physical Therapy

## 2019-12-04 DIAGNOSIS — U071 COVID-19: Secondary | ICD-10-CM | POA: Diagnosis not present

## 2019-12-09 ENCOUNTER — Ambulatory Visit (INDEPENDENT_AMBULATORY_CARE_PROVIDER_SITE_OTHER): Payer: BC Managed Care – PPO | Admitting: Nurse Practitioner

## 2019-12-09 VITALS — BP 122/84 | HR 80 | Temp 97.5°F | Ht 69.0 in | Wt 191.0 lb

## 2019-12-09 DIAGNOSIS — R5381 Other malaise: Secondary | ICD-10-CM | POA: Diagnosis not present

## 2019-12-09 DIAGNOSIS — R0602 Shortness of breath: Secondary | ICD-10-CM | POA: Diagnosis not present

## 2019-12-09 DIAGNOSIS — R002 Palpitations: Secondary | ICD-10-CM

## 2019-12-09 DIAGNOSIS — Z8616 Personal history of COVID-19: Secondary | ICD-10-CM | POA: Diagnosis not present

## 2019-12-09 NOTE — Patient Instructions (Addendum)
Covid 19 Shortness of breath with exertion:   Stay well hydrated  Stay active  Deep breathing exercises  May start vitamin C 2,000 mg daily, vitamin D3 2,000 IU daily, Zinc 220 mg daily  May take tylenol or fever or pain  May take mucinex DM twice daily  Will order chest x ray  Will order labs   Heart palpitations:  Will place referral to cardiology  Deconditioning:  Will order PT   Follow up:  Follow up in 2 weeks or sooner if needed

## 2019-12-09 NOTE — Progress Notes (Addendum)
@Patient  ID: , male    DOB: [birth year], 54 y.o.   MRN: [id number]  Chief Complaint  Patient presents with  . New Patient (Initial Visit)    COVID 9/8 at [facility] steriods 5 days ago Sx: SOB, Dizziness , chest tightness    Referring provider: [facility], MD   54 year old male with history of hyperlipidemia.  Diagnosed with COVID early September 2021.  HPI  Patient presents today for post COVID care clinic visit.  Patient was diagnosed with Covid on 10/24/2019.  He was treated by PCP with antibiotics and prednisone.  He was noted on chest x-ray to have mild pneumonia.  He states that he continues to have shortness of breath with exertion especially when going up stairs.  He also complains of his heart racing at times.  He states that he does have extreme fatigue and dizziness at times.  He has tried to stay active.  Patient has been using duo nebs and albuterol as needed for shortness of breath.  He was on a extended course of prednisone over the past few weeks.  We discussed that his racing heart rate could be due to both of these medications. Denies f/c/s, n/v/d, hemoptysis, PND, chest pain or edema.    Note: Patient was walked in office today and O2 sats did drop to 92% on room air with minimal exertion.  Heart rate was stable.    No Known Allergies   There is no immunization history on file for this patient.  Past Medical History:  Diagnosis Date  . Hyperlipidemia     Tobacco History: Social History   Tobacco Use  Smoking Status Former Smoker  Smokeless Tobacco Never Used   Counseling given: Not Answered   Outpatient Encounter Medications as of 12/09/2019  Medication Sig  . albuterol (PROVENTIL) (2.5 MG/3ML) 0.083% nebulizer solution SMARTSIG:1 Vial(s) Via Inhaler Every 4 Hours PRN  . atorvastatin (LIPITOR) 40 MG tablet Take 40 mg by mouth at bedtime.  12/11/2019 ezetimibe (ZETIA) 10 MG tablet Take 10 mg by mouth daily.  Marland Kitchen  ipratropium-albuterol (DUONEB) 0.5-2.5 (3) MG/3ML SOLN Take by nebulization 3 (three) times daily as needed.  Marland Kitchen PROAIR RESPICLICK 108 (90 Base) MCG/ACT AEPB Inhale 2 puffs into the lungs every 4 (four) hours as needed.  . fluticasone (FLONASE) 50 MCG/ACT nasal spray Place 2 sprays into the nose daily.  . meclizine (ANTIVERT) 25 MG tablet Take 25 mg by mouth 3 (three) times daily as needed. (Patient not taking: Reported on 12/09/2019)   No facility-administered encounter medications on file as of 12/09/2019.     Review of Systems  Review of Systems  Constitutional: Positive for fatigue. Negative for fever.  HENT: Negative.   Respiratory: Positive for shortness of breath. Negative for cough.   Cardiovascular: Positive for palpitations. Negative for chest pain and leg swelling.  Gastrointestinal: Negative.   Allergic/Immunologic: Negative.   Neurological: Positive for dizziness and weakness.  Psychiatric/Behavioral: Negative.        Physical Exam  BP 122/84 (BP Location: Left Arm)   Pulse 80   Temp (!) 97.5 F (36.4 C)   Ht 5\' 9"  (1.753 m)   Wt 191 lb (86.6 kg)   SpO2 95%   BMI 28.21 kg/m   Wt Readings from Last 5 Encounters:  12/09/19 191 lb (86.6 kg)  05/29/11 184 lb 6.4 oz (83.6 kg)  05/10/11 186 lb (84.4 kg)     Physical Exam Vitals and nursing note reviewed.  Constitutional:      General: He is not in acute distress.    Appearance: He is well-developed.  Cardiovascular:     Rate and Rhythm: Normal rate and regular rhythm.  Pulmonary:     Effort: Pulmonary effort is normal.     Breath sounds: Rhonchi (right base) present.  Musculoskeletal:     Right lower leg: No edema.     Left lower leg: No edema.  Skin:    General: Skin is warm and dry.  Neurological:     Mental Status: He is alert and oriented to person, place, and time.  Psychiatric:        Mood and Affect: Mood normal.        Behavior: Behavior normal.      Lab Results:  CBC    Component  Value Date/Time   WBC 6.5 12/09/2019 1544   RBC 4.77 12/09/2019 1544   HGB 15.2 12/09/2019 1544   HCT 43.4 12/09/2019 1544   PLT 239 12/09/2019 1544   MCV 91 12/09/2019 1544   MCH 31.9 12/09/2019 1544   MCHC 35.0 12/09/2019 1544   RDW 14.3 12/09/2019 1544    BMET    Component Value Date/Time   NA 141 12/09/2019 1544   K 4.3 12/09/2019 1544   CL 104 12/09/2019 1544   CO2 23 12/09/2019 1544   GLUCOSE 128 (H) 12/09/2019 1544   BUN 15 12/09/2019 1544   CREATININE 0.93 12/09/2019 1544   CALCIUM 9.4 12/09/2019 1544   GFRNONAA 93 12/09/2019 1544   GFRAA 107 12/09/2019 1544      Assessment & Plan:   History of COVID-19 Shortness of breath with exertion:   Stay well hydrated  Stay active  Deep breathing exercises  May start vitamin C 2,000 mg daily, vitamin D3 2,000 IU daily, Zinc 220 mg daily  May take tylenol or fever or pain  May take mucinex DM twice daily  Will order chest x ray  Will order labs   Heart palpitations:  Will place referral to cardiology  Deconditioning:  Will order PT   Follow up:  Follow up in 2 weeks or sooner if needed      Ivonne Andrew, NP 12/10/2019

## 2019-12-10 ENCOUNTER — Ambulatory Visit
Admission: RE | Admit: 2019-12-10 | Discharge: 2019-12-10 | Disposition: A | Discharge status: Home / Self Care | Payer: BC Managed Care – PPO | Source: Ambulatory Visit | Attending: Nurse Practitioner | Admitting: Nurse Practitioner

## 2019-12-10 ENCOUNTER — Other Ambulatory Visit: Payer: Self-pay

## 2019-12-10 DIAGNOSIS — R0602 Shortness of breath: Secondary | ICD-10-CM | POA: Insufficient documentation

## 2019-12-10 DIAGNOSIS — R5381 Other malaise: Secondary | ICD-10-CM | POA: Insufficient documentation

## 2019-12-10 DIAGNOSIS — Z8616 Personal history of COVID-19: Secondary | ICD-10-CM | POA: Insufficient documentation

## 2019-12-10 DIAGNOSIS — R002 Palpitations: Secondary | ICD-10-CM | POA: Insufficient documentation

## 2019-12-10 LAB — CBC
Hematocrit: 43.4 % (ref 37.5–51.0)
Hemoglobin: 15.2 g/dL (ref 13.0–17.7)
MCH: 31.9 pg (ref 26.6–33.0)
MCHC: 35 g/dL (ref 31.5–35.7)
MCV: 91 fL (ref 79–97)
Platelets: 239 10*3/uL (ref 150–450)
RBC: 4.77 x10E6/uL (ref 4.14–5.80)
RDW: 14.3 % (ref 11.6–15.4)
WBC: 6.5 10*3/uL (ref 3.4–10.8)

## 2019-12-10 LAB — COMPREHENSIVE METABOLIC PANEL
ALT: 51 IU/L — ABNORMAL HIGH (ref 0–44)
AST: 17 IU/L (ref 0–40)
Albumin/Globulin Ratio: 1.7 (ref 1.2–2.2)
Albumin: 4 g/dL (ref 3.8–4.9)
Alkaline Phosphatase: 74 IU/L (ref 44–121)
BUN/Creatinine Ratio: 16 (ref 9–20)
BUN: 15 mg/dL (ref 6–24)
Bilirubin Total: 0.6 mg/dL (ref 0.0–1.2)
CO2: 23 mmol/L (ref 20–29)
Calcium: 9.4 mg/dL (ref 8.7–10.2)
Chloride: 104 mmol/L (ref 96–106)
Creatinine, Ser: 0.93 mg/dL (ref 0.76–1.27)
GFR calc Af Amer: 107 mL/min/{1.73_m2} (ref >59)
GFR calc non Af Amer: 93 mL/min/{1.73_m2} (ref >59)
Globulin, Total: 2.3 g/dL (ref 1.5–4.5)
Glucose: 128 mg/dL — ABNORMAL HIGH (ref 65–99)
Potassium: 4.3 mmol/L (ref 3.5–5.2)
Sodium: 141 mmol/L (ref 134–144)
Total Protein: 6.3 g/dL (ref 6.0–8.5)

## 2019-12-10 NOTE — Assessment & Plan Note (Signed)
Shortness of breath with exertion:   Stay well hydrated  Stay active  Deep breathing exercises  May start vitamin C 2,000 mg daily, vitamin D3 2,000 IU daily, Zinc 220 mg daily  May take tylenol or fever or pain  May take mucinex DM twice daily  Will order chest x ray  Will order labs   Heart palpitations:  Will place referral to cardiology  Deconditioning:  Will order PT   Follow up:  Follow up in 2 weeks or sooner if needed

## 2019-12-12 ENCOUNTER — Other Ambulatory Visit: Payer: Self-pay | Admitting: Nurse Practitioner

## 2019-12-12 MED ORDER — AZITHROMYCIN 250 MG PO TABS: ORAL_TABLET | ORAL | 0 refills | Status: DC

## 2019-12-21 NOTE — Progress Notes (Signed)
Cardiology Office Note:    Date:  12/22/2019   ID:  KASHIF POOLER, DOB 1965/04/29, MRN [id number]  PCP:  Mosetta Putt, MD  [facility] Cardiologist:  No primary care provider on file.  [facility] Electrophysiologist:  None   Referring MD: Ivonne Andrew, NP    History of Present Illness:    Frank Rice is a 54 y.o. male with a hx of of HLD and COVID-19 infection (10/22/2019) who was referred by Angus Seller, NP for further evaluation SOB with exertion.  Patient was diagnosed with COVID-19 in September. Was treated by PCP with ABX and prednisone. He was noted on CXR to have mild pneumonia. Since his diagnosis, he continues to have DOE especially with stairs. He also has noted that he feels his "heart racing." He has been using his inhalers frequently and was placed on an extended course of prednisone for symptoms. Has some associated dizziness but no chest pain, nausea, syncope, diaphoresis. He is now referred to cardiology for further evaluation.  Patient states he was very symptomatic with COVID with fever, body aches, shortness of breath and myalgias. Home health came in and he was placed on continuous oxygen. Was placed on ABX and steroids for 10 days and then received another 10 day course of steroids due to persistence of symptoms.   Today, patient states he continues to have shortness of breath on exertion. He is needing to stop while working in the yard (different from prior to having COVID). Able to climb a flight of stairs but feels winded (better than when he had COVID but nowhere near where he was before he had COVID). Has pleuritic chest pain that he experiences when taking a deep breath. The pain is non-exertional and non-positional.  Denies any chest pain with exertion, nausea, vomiting, diaphoresis, bleeding issues. No LE edema, palpitations, or orthopnea. Notes his resting HR is faster and HR increases significantly with minimal exertion since having  COVID.  Family history:  Dad with ACB at age 43. Mother passed away from COPD. No known history of early CAD/SCD.   Past Medical History:  Diagnosis Date  . Hyperlipidemia     Past Surgical History:  Procedure Laterality Date  . APPENDECTOMY    . HERNIA REPAIR      Current Medications: Current Meds  Medication Sig  . atorvastatin (LIPITOR) 40 MG tablet Take 40 mg by mouth at bedtime.  Marland Kitchen ezetimibe (ZETIA) 10 MG tablet Take 10 mg by mouth daily.     Allergies:   Patient has no known allergies.   Social History   Socioeconomic History  . Marital status: Married    Spouse name: Not on file  . Number of children: Not on file  . Years of education: Not on file  . Highest education level: Not on file  Occupational History  . Not on file  Tobacco Use  . Smoking status: Former Games developer  . Smokeless tobacco: Never Used  Substance and Sexual Activity  . Alcohol use: Yes    Comment: occ  . Drug use: Never  . Sexual activity: Not on file  Other Topics Concern  . Not on file  Social History Narrative  . Not on file   Social Determinants of Health   Financial Resource Strain:   . Difficulty of Paying Living Expenses: Not on file  Food Insecurity:   . Worried About Programme researcher, broadcasting/film/video in the Last Year: Not on file  . Ran Out of Food  in the Last Year: Not on file  Transportation Needs:   . Lack of Transportation (Medical): Not on file  . Lack of Transportation (Non-Medical): Not on file  Physical Activity:   . Days of Exercise per Week: Not on file  . Minutes of Exercise per Session: Not on file  Stress:   . Feeling of Stress : Not on file  Social Connections:   . Frequency of Communication with Friends and Family: Not on file  . Frequency of Social Gatherings with Friends and Family: Not on file  . Attends Religious Services: Not on file  . Active Member of Clubs or Organizations: Not on file  . Attends Banker Meetings: Not on file  . Marital Status:  Not on file     Family History: Father ACB at ag e 38 Mother COPD Siblings healthy  ROS:   Please see the history of present illness.    Review of Systems  Constitutional: Positive for malaise/fatigue. Negative for chills and fever.  HENT: Negative for hearing loss.   Eyes: Negative for blurred vision.  Respiratory: Positive for shortness of breath. Negative for cough.   Cardiovascular: Negative for palpitations, orthopnea, claudication, leg swelling and PND.  Gastrointestinal: Negative for abdominal pain, nausea and vomiting.  Genitourinary: Negative for hematuria.  Musculoskeletal: Positive for myalgias.  Neurological: Negative for dizziness and loss of consciousness.  Psychiatric/Behavioral: Negative for substance abuse.    EKGs/Labs/Other Studies Reviewed:    The following studies were reviewed today: No prior cardiac testing  EKG:  EKG is  ordered today.  The ekg ordered today demonstrates NSR with HR 61  Recent Labs: 12/09/2019: ALT 51; BUN 15; Creatinine, Ser 0.93; Hemoglobin 15.2; Platelets 239; Potassium 4.3; Sodium 141  Recent Lipid Panel No results found for: CHOL, TRIG, HDL, CHOLHDL, VLDL, LDLCALC, LDLDIRECT     Physical Exam:    VS:  BP 120/68   Pulse 61   Ht 5\' 9"  (1.753 m)   Wt 189 lb 6.4 oz (85.9 kg)   SpO2 96%   BMI 27.97 kg/m     Wt Readings from Last 3 Encounters:  12/22/19 189 lb 6.4 oz (85.9 kg)  12/09/19 191 lb (86.6 kg)  05/29/11 184 lb 6.4 oz (83.6 kg)     GEN:  Well nourished, well developed in no acute distress HEENT: Normal NECK: No JVD; No carotid bruits CARDIAC: RRR, no murmurs, rubs, gallops RESPIRATORY:  Clear to auscultation without rales, wheezing or rhonchi  ABDOMEN: Soft, non-tender, non-distended MUSCULOSKELETAL:  No edema; No deformity  SKIN: Warm and dry NEUROLOGIC:  Alert and oriented x 3 PSYCHIATRIC:  Normal affect   ASSESSMENT:    1. SOB (shortness of breath)   2. Pure hypercholesterolemia    PLAN:    In  order of problems listed above:  #DOE: Likely related to recent COVID infection, however, does have risk factors with significantly elevated LDL cholesterol with concern for underlying familial hypercholesterolemia. Has been on atorva and zetia with improved levels per report. Will check TTE and risk stratify with coronary calcium score.  -Check TTE -Check coronary calcium score for risk stratification; if signficantly elevated, can pursue further testing at that time   #Hyperlipidemia: #Suspected familial hypercholesterolemia: Per patient, his LDL cholesterol wa significantly elevated with concern for genetic etiology. Currently on liptor and zetia. -Check calcium score as above -Continue atorva 40mg  and zetia 10mg  daily -Repeat cholesterol panel per PCP   Shared Decision Making/Informed Consent  Medication Adjustments/Labs and Tests Ordered: Current medicines are reviewed at length with the patient today.  Concerns regarding medicines are outlined above.  Orders Placed This Encounter  Procedures  . CT CARDIAC SCORING  . EKG 12-Lead  . ECHOCARDIOGRAM COMPLETE   No orders of the defined types were placed in this encounter.   Patient Instructions  Medication Instructions:  Your physician recommends that you continue on your current medications as directed. Please refer to the Current Medication list given to you today.  *If you need a refill on your cardiac medications before your next appointment, please call your pharmacy*   Lab Work: None If you have labs (blood work) drawn today and your tests are completely normal, you will receive your results only by: Marland Kitchen MyChart Message (if you have MyChart) OR . A paper copy in the mail If you have any lab test that is abnormal or we need to change your treatment, we will call you to review the results.   Testing/Procedures: Your physician has requested that you have an echocardiogram. Echocardiography is a painless test that  uses sound waves to create images of your heart. It provides your doctor with information about the size and shape of your heart and how well your heart's chambers and valves are working. This procedure takes approximately one hour. There are no restrictions for this procedure.  Your physician recommends that you have a Calcium Score performed.   Follow-Up: At [facility], you and your health needs are our priority.  As part of our continuing mission to provide you with exceptional heart care, we have created designated Provider Care Teams.  These Care Teams include your primary Cardiologist (physician) and Advanced Practice Providers (APPs -  Physician Assistants and Nurse Practitioners) who all work together to provide you with the care you need, when you need it.  We recommend signing up for the patient portal called "MyChart".  Sign up information is provided on this After Visit Summary.  MyChart is used to connect with patients for Virtual Visits (Telemedicine).  Patients are able to view lab/test results, encounter notes, upcoming appointments, etc.  Non-urgent messages can be sent to your provider as well.   To learn more about what you can do with MyChart, go to [email or web address].    Your next appointment:   3 month(s)  The format for your next appointment:   In Person  Provider:   You may see Laurance Flatten, MD or one of the following Advanced Practice Providers on your designated Care Team:    Tereso Newcomer, PA-C  Chelsea Aus, [place]    Other Instructions      Signed, Meriam Sprague, MD  12/22/2019 3:53 PM    [facility]

## 2019-12-22 ENCOUNTER — Other Ambulatory Visit: Payer: Self-pay

## 2019-12-22 ENCOUNTER — Ambulatory Visit: Payer: BC Managed Care – PPO | Admitting: Cardiology

## 2019-12-22 VITALS — BP 120/68 | HR 61 | Ht 69.0 in | Wt 189.4 lb

## 2019-12-22 DIAGNOSIS — E7800 Pure hypercholesterolemia, unspecified: Secondary | ICD-10-CM | POA: Diagnosis not present

## 2019-12-22 DIAGNOSIS — R0602 Shortness of breath: Secondary | ICD-10-CM | POA: Diagnosis not present

## 2019-12-22 NOTE — Patient Instructions (Signed)
Medication Instructions:  Your physician recommends that you continue on your current medications as directed. Please refer to the Current Medication list given to you today.  *If you need a refill on your cardiac medications before your next appointment, please call your pharmacy*   Lab Work: None If you have labs (blood work) drawn today and your tests are completely normal, you will receive your results only by: . MyChart Message (if you have MyChart) OR . A paper copy in the mail If you have any lab test that is abnormal or we need to change your treatment, we will call you to review the results.   Testing/Procedures: Your physician has requested that you have an echocardiogram. Echocardiography is a painless test that uses sound waves to create images of your heart. It provides your doctor with information about the size and shape of your heart and how well your heart's chambers and valves are working. This procedure takes approximately one hour. There are no restrictions for this procedure.  Your physician recommends that you have a Calcium Score performed.   Follow-Up: At [facility], you and your health needs are our priority.  As part of our continuing mission to provide you with exceptional heart care, we have created designated Provider Care Teams.  These Care Teams include your primary Cardiologist (physician) and Advanced Practice Providers (APPs -  Physician Assistants and Nurse Practitioners) who all work together to provide you with the care you need, when you need it.  We recommend signing up for the patient portal called "MyChart".  Sign up information is provided on this After Visit Summary.  MyChart is used to connect with patients for Virtual Visits (Telemedicine).  Patients are able to view lab/test results, encounter notes, upcoming appointments, etc.  Non-urgent messages can be sent to your provider as well.   To learn more about what you can do with MyChart, go to  [email or web address].    Your next appointment:   3 month(s)  The format for your next appointment:   In Person  Provider:   You may see Heather Pemberton, MD or one of the following Advanced Practice Providers on your designated Care Team:    Scott Weaver, PA-C  Vin Bhagat, PA-C    Other Instructions   

## 2019-12-29 DIAGNOSIS — D2262 Melanocytic nevi of left upper limb, including shoulder: Secondary | ICD-10-CM | POA: Diagnosis not present

## 2019-12-29 DIAGNOSIS — Z85828 Personal history of other malignant neoplasm of skin: Secondary | ICD-10-CM | POA: Diagnosis not present

## 2019-12-29 DIAGNOSIS — D225 Melanocytic nevi of trunk: Secondary | ICD-10-CM | POA: Diagnosis not present

## 2019-12-29 DIAGNOSIS — L821 Other seborrheic keratosis: Secondary | ICD-10-CM | POA: Diagnosis not present

## 2019-12-31 ENCOUNTER — Other Ambulatory Visit: Payer: Self-pay

## 2019-12-31 ENCOUNTER — Encounter: Payer: Self-pay | Admitting: Physical Therapy

## 2019-12-31 ENCOUNTER — Ambulatory Visit: Payer: BC Managed Care – PPO | Attending: Nurse Practitioner | Admitting: Physical Therapy

## 2019-12-31 DIAGNOSIS — R2689 Other abnormalities of gait and mobility: Secondary | ICD-10-CM | POA: Diagnosis not present

## 2019-12-31 DIAGNOSIS — M6281 Muscle weakness (generalized): Secondary | ICD-10-CM | POA: Diagnosis not present

## 2019-12-31 DIAGNOSIS — M25552 Pain in left hip: Secondary | ICD-10-CM | POA: Diagnosis not present

## 2019-12-31 NOTE — Patient Instructions (Signed)
Access Code: [email or web address] URL: [email or web address] Date: 12/31/2019 Prepared by: Rosana Hoes  Exercises Clamshell with Resistance - 1 x daily - 7 x weekly - 2-3 sets - 10 reps Supine Bridge with Resistance Band - 1 x daily - 7 x weekly - 2-3 sets - 10 reps - 3-5 seconds hold Sit to Stand without Arm Support - 1 x daily - 7 x weekly - 2-3 sets - 10 reps

## 2020-01-01 NOTE — Therapy (Addendum)
[place], [place], [address] Phone: [phone]   Fax:  [phone]  Physical Therapy Evaluation / Discharge  Patient Details  Name: Frank Rice MRN: [id number] Date of Birth: [birth year] Referring Provider (PT): Fenton Foy, NP (COVID); Magnus Sinning, MD (hip) (POC cert sent to [facility])   Encounter Date: 12/31/2019   PT End of Session - 12/31/19 1701    Visit Number 1    Number of Visits 8    Date for PT Re-Evaluation 02/25/20    Authorization Type BCBS    Authorization Time Period FOTO by 6th visit    PT Start Time 1615    PT Stop Time 1658    PT Time Calculation (min) 43 min    Activity Tolerance Patient tolerated treatment well    Behavior During Therapy [facility] for tasks assessed/performed           Past Medical History:  Diagnosis Date  . Hyperlipidemia     Past Surgical History:  Procedure Laterality Date  . APPENDECTOMY    . HERNIA REPAIR      There were no vitals filed for this visit.    Subjective Assessment - 12/31/19 1626    Subjective Patient report history of COVID and pneumonia. He reports he continues to have some discomfort in the front of his chest with deep breathes but his heart doctor told him it is likely the lining around his lungs being irritated. Currently he feels better in reagrd to activity level, he doesn't get out of breathe or exhausted with general walking or stairs but has to take breaks with yardwork. He does note chronic left hip burisits that he has gotten 3-4 injections. Walking or sitting for extended periods bring on left hip pain. Patient reports back in the work office for about 5 weeks now, has not been as active recently..    Limitations Walking;House hold activities;Lifting;Sitting;Standing    How long can you sit comfortably? Can sit as long as needed, has to get up after a couple hours, can't cross his legs    How long can you stand  comfortably? 10-15 minutes when hurting    How long can you walk comfortably? 20-30 minutes when hurting    Diagnostic tests MRI left hip shows glute med tendinosis    Patient Stated Goals Get back to prior level of activity    Currently in Pain? Yes    Pain Score 0-No pain    Pain Location Hip    Pain Orientation Left;Lateral    Pain Descriptors / Indicators Dull;Sharp    Pain Type Chronic pain    Pain Radiating Towards occasionally lateral thigh    Pain Onset More than a month ago    Pain Frequency Intermittent    Aggravating Factors  Standing, walking, lying on left side, crossing left leg over    Pain Relieving Factors Injections    Effect of Pain on Daily Activities Limited with walking and yardwork              [facility] PT Assessment - 01/01/20 0001      Assessment   Medical Diagnosis Physical deconditioning, History of COVID-19, Chronic left hip pain    Referring Provider (PT) Fenton Foy, NP (COVID); Magnus Sinning, MD (hip)   POC cert sent to [facility]   Onset Date/Surgical Date 07/23/18   left hip pain   Next MD Visit Not scheduled    Prior Therapy None  Precautions   Precautions None      Restrictions   Weight Bearing Restrictions No      Balance Screen   Has the patient fallen in the past 6 months No    Has the patient had a decrease in activity level because of a fear of falling?  No    Is the patient reluctant to leave their home because of a fear of falling?  No      Prior Function   Level of Independence Independent    Vocation Full time employment    Vocation Requirements Majority of time sitting at desk      Cognition   Overall Cognitive Status Within Functional Limits for tasks assessed      Observation/Other Assessments   Observations Patient appears in no apparent distress    Focus on Therapeutic Outcomes (FOTO)  52% functional status      Sensation   Light Touch Appears Intact      Coordination   Gross Motor Movements are Fluid and  Coordinated Yes      Functional Tests   Functional tests Squat      Squat   Comments Minimal dynamic knee valgus greater on left      ROM / Strength   AROM / PROM / Strength AROM;PROM;Strength      AROM   Overall AROM Comments Lumbar AROM grossly WFL and non-painful, no concordant symptoms reproduced with lumbar testing      PROM   Overall PROM Comments Hip PROM grossly [facility]      Strength   Strength Assessment Site Hip;Knee    Right/Left Hip Right;Left    Right Hip Flexion 4+/5    Right Hip Extension 4/5    Right Hip ABduction 4/5    Left Hip Flexion 4+/5    Left Hip Extension 4-/5    Left Hip ABduction 4-/5   increased left hip discomfort   Right/Left Knee Right;Left    Right Knee Flexion 5/5    Right Knee Extension 5/5    Left Knee Flexion 5/5    Left Knee Extension 5/5      Palpation   Palpation comment TTP left superior/posterior aspect of greater trochanter      Transfers   Transfers Independent with all Transfers                      Objective measurements completed on examination: See above findings.       [facility] Adult PT Treatment/Exercise - 01/01/20 0001      Exercises   Exercises Knee/Hip      Knee/Hip Exercises: Seated   Sit to Sand 10 reps      Knee/Hip Exercises: Supine   Bridges 10 reps    Bridges Limitations red band around knees      Knee/Hip Exercises: Sidelying   Clams x 15 with red band                  PT Education - 12/31/19 1636    Education Details Exam findings, POC, HEP    Person(s) Educated Patient    Methods Explanation;Demonstration;Tactile cues;Verbal cues;Handout    Comprehension Verbalized understanding;Returned demonstration;Verbal cues required;Tactile cues required;Need further instruction            PT Short Term Goals - 12/31/19 1702      PT SHORT TERM GOAL #1   Title Patient will be I with initial HEP to progress with PT    Time  4    Period Weeks    Status New    Target Date 02/25/20       PT SHORT TERM GOAL #2   Title PT will review FOTO with patient by 3rd visit    Time 3    Period Weeks    Status New    Target Date 01/21/20             PT Long Term Goals - 12/31/19 1703      PT LONG TERM GOAL #1   Title Patient will be I with final HEP to maintain progress from PT    Time 8    Period Weeks    Status New    Target Date 02/25/20      PT LONG TERM GOAL #2   Title Patient will demonstrate improved left hip strength >/= 4/5 MMT to improve tolerance for walking and reduce pain    Time 8    Period Weeks    Status New    Target Date 02/25/20      PT LONG TERM GOAL #3   Title Patient will report ability to walk > 1 hour without limitation to improve aerobic endurance    Time 8    Period Weeks    Status New    Target Date 02/25/20      PT LONG TERM GOAL #4   Title Patient will report no limitation with heavy household tasks or yardwork    Time 8    Period Weeks    Status New    Target Date 02/25/20      PT LONG TERM GOAL #5   Title Patient will report improve functional status >/= 64% on FOTO    Time 8    Period Weeks    Status New    Target Date 02/25/20                  Plan - 01/01/20 0804    Clinical Impression Statement Patient presents to PT with referrals for both deconditioning due to COVID-19 and prior left hip pain. Currently patient doesn't seem to be limited due to effects of COVID-19, only persistent chest pain that he was told is likely related to irritation of lung lining. He does continue to have left hip pain and weakness, that seems consistent with gluteal tendinopathy. He was provided exercises to initiate strengthening for left hip and general LE conditioning, and patient would benefit from continued skilled PT to progress strength and aerobic endurance in order to return to prior level of activity such as walking and yardwork.    Personal Factors and Comorbidities Fitness;Time since onset of injury/illness/exacerbation     Examination-Activity Limitations Locomotion Level;Sit;Stand;Lift;Stairs    Examination-Participation Restrictions Community Activity;Yard Work    Stability/Clinical Decision Making Stable/Uncomplicated    Clinical Decision Making Low    Rehab Potential Good    PT Frequency 1x / week    PT Duration 8 weeks    PT Treatment/Interventions ADLs/Self Care Home Management;Cryotherapy;Electrical Stimulation;Iontophoresis 88m/ml Dexamethasone;Moist Heat;Neuromuscular re-education;Balance training;Therapeutic exercise;Therapeutic activities;Functional mobility training;Stair training;Gait training;Patient/family education;Manual techniques;Dry needling;Passive range of motion;Taping;Spinal Manipulations;Joint Manipulations    PT Next Visit Plan Review HEP and progress PRN, progress gluteal and core strengthening, aerobic endurance    PT Home Exercise Plan [email or web address] clamshell, bridge, squat to chair    Consulted and Agree with Plan of Care Patient           Patient will benefit from skilled therapeutic intervention in order  to improve the following deficits and impairments:  Decreased strength, Improper body mechanics, Pain, Decreased activity tolerance, Cardiopulmonary status limiting activity  Visit Diagnosis: Pain in left hip  Muscle weakness (generalized)  Other abnormalities of gait and mobility     Problem List Patient Active Problem List   Diagnosis Date Noted  . History of COVID-19 12/10/2019  . Heart palpitations 12/10/2019  . Shortness of breath 12/10/2019  . Physical deconditioning 12/10/2019  . COVID-19 virus infection 11/04/2019    Hilda Blades, PT, DPT, LAT, ATC 01/01/20  8:13 AM Phone: [phone] Fax: [facility] [address] [place], [place], [address] Phone: [phone]   Fax:  [phone]  Name: Frank Rice MRN: [id number] Date of Birth: February 11, 1966   PHYSICAL THERAPY DISCHARGE  SUMMARY  Visits from Start of Care: 1  Current functional level related to goals / functional outcomes: See above   Remaining deficits: See above   Education / Equipment: HEP Plan:                                                    Patient goals were not met. Patient is being discharged due to not returning since the last visit.  ?????    Hilda Blades, PT, DPT, LAT, ATC 02/16/20  8:45 AM Phone: [phone] Fax: [phone]

## 2020-01-13 ENCOUNTER — Ambulatory Visit (INDEPENDENT_AMBULATORY_CARE_PROVIDER_SITE_OTHER): Payer: BC Managed Care – PPO | Admitting: Nurse Practitioner

## 2020-01-13 DIAGNOSIS — Z8616 Personal history of COVID-19: Secondary | ICD-10-CM | POA: Diagnosis not present

## 2020-01-13 NOTE — Progress Notes (Signed)
@Patient  ID: , male    DOB: 03-22-1965, 54 y.o.   MRN: [id number]  Chief Complaint  Patient presents with  . Follow-up    Doing alot better, no questions or concerns.     Referring provider: [id number], MD   54 year old male with history of hyperlipidemia.  Diagnosed with COVID early September 2021.   HPI  Patient presents today for post COVID care clinic visit follow-up.  Patient was diagnosed with Covid on 10/24/2019.  He was treated with antibiotics and prednisone chest x-ray showed mild pneumonia.  Patient was seen here on 12/09/2019.  He was referred to cardiology and physical therapy.  He has seen both cardiology and physical therapy.  Patient states that overall he is feeling much better since his last visit here.  Patient is scheduled for repeat imaging but cardiology is planning to do a CT scan in a couple weeks.  Will order for chest x-ray today. Denies f/c/s, n/v/d, hemoptysis, PND, chest pain or edema      No Known Allergies   There is no immunization history on file for this patient.  Past Medical History:  Diagnosis Date  . Hyperlipidemia     Tobacco History: Social History   Tobacco Use  Smoking Status Former Smoker  Smokeless Tobacco Never Used   Counseling given: Not Answered   Outpatient Encounter Medications as of 01/13/2020  Medication Sig  . atorvastatin (LIPITOR) 40 MG tablet Take 40 mg by mouth at bedtime.  01/15/2020 ezetimibe (ZETIA) 10 MG tablet Take 10 mg by mouth daily.  . meclizine (ANTIVERT) 25 MG tablet Take 25 mg by mouth 3 (three) times daily as needed.  (Patient not taking: Reported on 12/22/2019)   No facility-administered encounter medications on file as of 01/13/2020.     Review of Systems  Review of Systems  Constitutional: Negative.  Negative for fever.  HENT: Negative.   Respiratory: Negative for cough and shortness of breath.   Cardiovascular: Negative.  Negative for chest pain, palpitations and leg  swelling.  Gastrointestinal: Negative.   Allergic/Immunologic: Negative.   Neurological: Negative.   Psychiatric/Behavioral: Negative.        Physical Exam  Temp (!) 97.5 F (36.4 C)   Ht 5\' 9"  (1.753 m)   Wt 195 lb 0.1 oz (88.5 kg)   BMI 28.80 kg/m   Wt Readings from Last 5 Encounters:  01/13/20 195 lb 0.1 oz (88.5 kg)  12/22/19 189 lb 6.4 oz (85.9 kg)  12/09/19 191 lb (86.6 kg)  05/29/11 184 lb 6.4 oz (83.6 kg)  05/10/11 186 lb (84.4 kg)     Physical Exam Vitals and nursing note reviewed.  Constitutional:      General: He is not in acute distress.    Appearance: He is well-developed.  Cardiovascular:     Rate and Rhythm: Normal rate and regular rhythm.  Pulmonary:     Effort: Pulmonary effort is normal.     Breath sounds: Normal breath sounds.  Musculoskeletal:     Right lower leg: No edema.     Left lower leg: No edema.  Skin:    General: Skin is warm and dry.  Neurological:     Mental Status: He is alert and oriented to person, place, and time.  Psychiatric:        Mood and Affect: Mood normal.        Behavior: Behavior normal.        Assessment & Plan:   History of COVID-19  Stay well hydrated  Stay active  Deep breathing exercises  May start vitamin C 2,000 daily, vitamin D3 daily, Zinc daily  May take tylenol or fever or pain  May take mucinex DM twice daily if needed     Please keep upcoming appointments with cardiology     Follow up:  Follow up if needed      Ivonne Andrew, NP 01/14/2020

## 2020-01-13 NOTE — Patient Instructions (Signed)
Covid 19:   Stay well hydrated  Stay active  Deep breathing exercises  May start vitamin C 2,000 daily, vitamin D3 daily, Zinc daily  May take tylenol or fever or pain  May take mucinex DM twice daily if needed     Please keep upcoming appointments with cardiology     Follow up:  Follow up if needed

## 2020-01-14 NOTE — Assessment & Plan Note (Signed)
Stay well hydrated  Stay active  Deep breathing exercises  May start vitamin C 2,000 daily, vitamin D3 daily, Zinc daily  May take tylenol or fever or pain  May take mucinex DM twice daily if needed     Please keep upcoming appointments with cardiology     Follow up:  Follow up if needed

## 2020-01-20 ENCOUNTER — Ambulatory Visit (HOSPITAL_COMMUNITY): Payer: BC Managed Care – PPO | Attending: Internal Medicine

## 2020-01-20 ENCOUNTER — Ambulatory Visit (INDEPENDENT_AMBULATORY_CARE_PROVIDER_SITE_OTHER)
Admission: RE | Admit: 2020-01-20 | Discharge: 2020-01-20 | Disposition: A | Discharge status: Home / Self Care | Payer: Self-pay | Source: Ambulatory Visit | Attending: Cardiology | Admitting: Cardiology

## 2020-01-20 ENCOUNTER — Other Ambulatory Visit: Payer: Self-pay

## 2020-01-20 DIAGNOSIS — R0602 Shortness of breath: Secondary | ICD-10-CM | POA: Diagnosis not present

## 2020-01-20 LAB — ECHOCARDIOGRAM COMPLETE
Area-P 1/2: 2.49 cm2
S' Lateral: 2.1 cm

## 2020-01-21 ENCOUNTER — Telehealth: Payer: Self-pay

## 2020-01-21 NOTE — Telephone Encounter (Signed)
-----   Message from Meriam Sprague, MD sent at 01/21/2020  1:25 PM EST ----- Patient's calcium score is low at 6. This means he has very minimal plaque. He can continue the statin and zetia as this will help prevent progression of disease. Overall, this is great news and means that he is low risk at this time.

## 2020-01-21 NOTE — Telephone Encounter (Signed)
-----   Message from Meriam Sprague, MD sent at 01/20/2020  9:10 PM EST ----- Echo shows normal pumping function with no significant valvular disease. This is great news. I will follow-up on his coronary calcium score.

## 2020-01-21 NOTE — Telephone Encounter (Signed)
The patient has been notified of the result and verbalized understanding.  All questions (if any) were answered. Leanord Hawking, RN 01/21/2020 1:31 PM

## 2020-01-21 NOTE — Telephone Encounter (Signed)
The patient has been notified of the result and verbalized understanding.  All questions (if any) were answered. Leanord Hawking, RN 01/21/2020 1:32 PM

## 2020-01-26 ENCOUNTER — Ambulatory Visit: Payer: BC Managed Care – PPO | Attending: Nurse Practitioner | Admitting: Physical Therapy

## 2020-01-26 ENCOUNTER — Telehealth: Payer: Self-pay | Admitting: Physical Therapy

## 2020-01-26 NOTE — Telephone Encounter (Signed)
Attempted to contact patient due to missed PT appointment. Left VM informing patient of missed appointment and this was his only scheduled visit. Reminded of attendance policy. Instructed to contact office to schedule follow-up as needed.  Rosana Hoes, PT, DPT, LAT, ATC 01/26/20  4:52 PM Phone: [phone] Fax: [phone]

## 2020-03-19 ENCOUNTER — Telehealth: Payer: Self-pay | Admitting: Physical Medicine and Rehabilitation

## 2020-03-19 NOTE — Telephone Encounter (Signed)
Left message #1

## 2020-03-19 NOTE — Telephone Encounter (Signed)
Received referral from Dr. Duaine Dredge for injection of left hamstring and left greater trochanteric bursa. Ok to repeat left ischial bursa and left greater trochanteric bursa injection? Last done 10/14/2019.

## 2020-03-19 NOTE — Telephone Encounter (Signed)
Yes okay 

## 2020-03-21 NOTE — Progress Notes (Signed)
Cardiology Office Note:    Date:  03/24/2020   ID:  Frank Rice, DOB 22-Mar-1965, MRN [id number]  PCP:  Mosetta Putt, MD  [facility] Cardiologist:  No primary care provider on file.  [facility] Electrophysiologist:  None   Referring MD: Mosetta Putt, MD    History of Present Illness:    Frank Rice is a 55 y.o. male with a hx of HLD and COVID-19 infection (10/22/2019) who returns to clinic for follow-up.  During our last visit on 12/22/19, he was experiencing significant DOE. This had been ongoing since he had COVID during 10/2019. TTE was obtained which showed normal BiV function with no significant valvular disease. Coronary calcium score 6.4. This was 65 percentile for age and sex matched control. He now returns to clinic for follow-up.  Today, the patient states that he feels better since last visit. He has been following with the COVID clinic, which was very helpful. Pleuritic chest pain has improved. No shortness of breath. Went scuba diving last week. No orthopnea, lightheadedness, dizziness, LE edema. Blood pressure well controlled. Per patient, cholesterol looks great with PCP. A1C is normal.   Past Medical History:  Diagnosis Date  . Hyperlipidemia     Past Surgical History:  Procedure Laterality Date  . APPENDECTOMY    . HERNIA REPAIR      Current Medications: Current Meds  Medication Sig  . atorvastatin (LIPITOR) 40 MG tablet Take 40 mg by mouth at bedtime.  Marland Kitchen ezetimibe (ZETIA) 10 MG tablet Take 10 mg by mouth daily.     Allergies:   Patient has no known allergies.   Social History   Socioeconomic History  . Marital status: Married    Spouse name: Not on file  . Number of children: Not on file  . Years of education: Not on file  . Highest education level: Not on file  Occupational History  . Not on file  Tobacco Use  . Smoking status: Former Games developer  . Smokeless tobacco: Never Used  Substance and Sexual Activity  . Alcohol use: Yes     Comment: occ  . Drug use: Never  . Sexual activity: Not on file  Other Topics Concern  . Not on file  Social History Narrative  . Not on file   Social Determinants of Health   Financial Resource Strain: Not on file  Food Insecurity: Not on file  Transportation Needs: Not on file  Physical Activity: Not on file  Stress: Not on file  Social Connections: Not on file     Family History: The patient's family history is not on file.  ROS:   Please see the history of present illness.    Review of Systems  Constitutional: Negative for chills and fever.  HENT: Negative for nosebleeds.   Eyes: Negative for blurred vision.  Respiratory: Negative for shortness of breath.   Cardiovascular: Negative for chest pain, palpitations, orthopnea, claudication, leg swelling and PND.  Gastrointestinal: Negative for heartburn, melena, nausea and vomiting.  Genitourinary: Negative for dysuria and hematuria.  Musculoskeletal: Negative for falls.  Neurological: Negative for dizziness and loss of consciousness.  Endo/Heme/Allergies: Negative for polydipsia.  Psychiatric/Behavioral: Negative for substance abuse.    EKGs/Labs/Other Studies Reviewed:    The following studies were reviewed today: TTE 2020/02/24: IMPRESSIONS  1. Left ventricular ejection fraction, by estimation, is 60 to 65%. Left  ventricular ejection fraction by 3D volume is 62 %. The left ventricle has  normal function. The left ventricle has  no regional wall motion  abnormalities. Left ventricular diastolic  parameters were normal. The average left ventricular global longitudinal  strain is 20.0 %. The global longitudinal strain is normal.  2. Right ventricular systolic function is normal. The right ventricular  size is mildly enlarged.  3. The mitral valve is grossly normal. No evidence of mitral valve  regurgitation.  4. The aortic valve is normal in structure. Aortic valve regurgitation is  not visualized.  5. The  inferior vena cava is normal in size with greater than 50%  respiratory variability, suggesting right atrial pressure of 3 mmHg.   Coronary calcium 01/20/20: FINDINGS: Non-cardiac: See separate report from [facility].  Ascending Aorta: Normal size, no calcifications.  Pericardium: Normal.  Coronary arteries: Normal origin.  IMPRESSION: Coronary calcium score of 6.4. This was 77 percentile for age and sex matched control.   Recent Labs: 12/09/2019: ALT 51; BUN 15; Creatinine, Ser 0.93; Hemoglobin 15.2; Platelets 239; Potassium 4.3; Sodium 141  Recent Lipid Panel No results found for: CHOL, TRIG, HDL, CHOLHDL, VLDL, LDLCALC, LDLDIRECT   Physical Exam:    VS:  BP 122/74   Pulse 68   Ht 5\' 9"  (1.753 m)   Wt 199 lb 3.2 oz (90.4 kg)   PF 97 L/min   BMI 29.42 kg/m     Wt Readings from Last 3 Encounters:  03/24/20 199 lb 3.2 oz (90.4 kg)  01/13/20 195 lb 0.1 oz (88.5 kg)  12/22/19 189 lb 6.4 oz (85.9 kg)     GEN:  Well nourished, well developed in no acute distress HEENT: Normal NECK: No JVD; No carotid bruits CARDIAC: RRR, no murmurs, rubs, gallops RESPIRATORY:  Clear to auscultation without rales, wheezing or rhonchi  ABDOMEN: Soft, non-tender, non-distended MUSCULOSKELETAL:  No edema; No deformity  SKIN: Warm and dry NEUROLOGIC:  Alert and oriented x 3 PSYCHIATRIC:  Normal affect   ASSESSMENT:    1. SOB (shortness of breath)   2. Pure hypercholesterolemia    PLAN:    In order of problems listed above:  #DOE: Significantly improved. Likely related to COVID infection in 10/2019. TTE with normal BiV function, no significant valvular abnormalities. Coronary calcium score only 6.4 with low suspicion for underlying CAD as primary etiology. Doing much better. -TTE with normal BiV function, no significant valve abnormalities -Calcium score 6.4 -Continue healthy diet and exercise   #Hyperlipidemia: #Suspected familial hypercholesterolemia: Well  controlled on most recent labs with PCP. -Ca score 6.4 -Continue atorva 40mg  and zetia 10mg  daily -Continue healthy diet and exercise as detailed below  Exercise recommendations: Goal of exercising for at least 30 minutes a day, at least 5 times per week.  Please exercise to a moderate exertion.  This means that while exercising it is difficult to speak in full sentences, however you are not so short of breath that you feel you must stop, and not so comfortable that you can carry on a full conversation.  Exertion level should be approximately a 5/10, if 10 is the most exertion you can perform.  Diet recommendations: Recommend a heart healthy diet such as the Mediterranean diet.  This diet consists of plant based foods, healthy fats, lean meats, olive oil.  It suggests limiting the intake of simple carbohydrates such as white breads, pastries, and pastas.  It also limits the amount of red meat, wine, and dairy products such as cheese that one should consume on a daily basis.  Follow-up in 1 year.  Medication Adjustments/Labs and Tests Ordered: Current  medicines are reviewed at length with the patient today.  Concerns regarding medicines are outlined above.  No orders of the defined types were placed in this encounter.  No orders of the defined types were placed in this encounter.   Patient Instructions  Medication Instructions:  Your physician recommends that you continue on your current medications as directed. Please refer to the Current Medication list given to you today.  *If you need a refill on your cardiac medications before your next appointment, please call your pharmacy*   Lab Work: none If you have labs (blood work) drawn today and your tests are completely normal, you will receive your results only by: Marland Kitchen MyChart Message (if you have MyChart) OR . A paper copy in the mail If you have any lab test that is abnormal or we need to change your treatment, we will call you to  review the results.   Testing/Procedures: none   Follow-Up: At [facility], you and your health needs are our priority.  As part of our continuing mission to provide you with exceptional heart care, we have created designated Provider Care Teams.  These Care Teams include your primary Cardiologist (physician) and Advanced Practice Providers (APPs -  Physician Assistants and Nurse Practitioners) who all work together to provide you with the care you need, when you need it.  We recommend signing up for the patient portal called "MyChart".  Sign up information is provided on this After Visit Summary.  MyChart is used to connect with patients for Virtual Visits (Telemedicine).  Patients are able to view lab/test results, encounter notes, upcoming appointments, etc.  Non-urgent messages can be sent to your provider as well.   To learn more about what you can do with MyChart, go to [email or web address].    Your next appointment:   1 year(s)  The format for your next appointment:   In Person  Provider:   You may see Dr. Laurance Flatten or one of the following Advanced Practice Providers on your designated Care Team:    Tereso Newcomer, PA-C  [place], [place]       Signed, Meriam Sprague, MD  03/24/2020 8:33 AM    [facility]

## 2020-03-23 NOTE — Telephone Encounter (Signed)
Left message #2

## 2020-03-24 ENCOUNTER — Other Ambulatory Visit: Payer: Self-pay

## 2020-03-24 ENCOUNTER — Encounter: Payer: Self-pay | Admitting: Cardiology

## 2020-03-24 ENCOUNTER — Ambulatory Visit (INDEPENDENT_AMBULATORY_CARE_PROVIDER_SITE_OTHER): Payer: 59 | Admitting: Cardiology

## 2020-03-24 ENCOUNTER — Encounter (INDEPENDENT_AMBULATORY_CARE_PROVIDER_SITE_OTHER): Payer: Self-pay

## 2020-03-24 VITALS — BP 122/74 | HR 68 | Ht 69.0 in | Wt 199.2 lb

## 2020-03-24 DIAGNOSIS — E7800 Pure hypercholesterolemia, unspecified: Secondary | ICD-10-CM

## 2020-03-24 DIAGNOSIS — R0602 Shortness of breath: Secondary | ICD-10-CM | POA: Diagnosis not present

## 2020-03-24 NOTE — Patient Instructions (Signed)
Medication Instructions:  Your physician recommends that you continue on your current medications as directed. Please refer to the Current Medication list given to you today.  *If you need a refill on your cardiac medications before your next appointment, please call your pharmacy*   Lab Work: none If you have labs (blood work) drawn today and your tests are completely normal, you will receive your results only by: Marland Kitchen MyChart Message (if you have MyChart) OR . A paper copy in the mail If you have any lab test that is abnormal or we need to change your treatment, we will call you to review the results.   Testing/Procedures: none   Follow-Up: At [facility], you and your health needs are our priority.  As part of our continuing mission to provide you with exceptional heart care, we have created designated Provider Care Teams.  These Care Teams include your primary Cardiologist (physician) and Advanced Practice Providers (APPs -  Physician Assistants and Nurse Practitioners) who all work together to provide you with the care you need, when you need it.  We recommend signing up for the patient portal called "MyChart".  Sign up information is provided on this After Visit Summary.  MyChart is used to connect with patients for Virtual Visits (Telemedicine).  Patients are able to view lab/test results, encounter notes, upcoming appointments, etc.  Non-urgent messages can be sent to your provider as well.   To learn more about what you can do with MyChart, go to [email or web address].    Your next appointment:   1 year(s)  The format for your next appointment:   In Person  Provider:   You may see Dr. Laurance Flatten or one of the following Advanced Practice Providers on your designated Care Team:    Tereso Newcomer, PA-C  [place], [place]

## 2020-05-25 ENCOUNTER — Telehealth: Payer: Self-pay | Admitting: Physical Medicine and Rehabilitation

## 2020-05-25 NOTE — Telephone Encounter (Signed)
Patient called needing to schedule an appointment for an injection in his hip. The number to contact patient is [phone]

## 2020-05-25 NOTE — Telephone Encounter (Signed)
Scheduled for 4/25 at 0815.

## 2020-05-25 NOTE — Telephone Encounter (Signed)
Yes if same etc 

## 2020-05-25 NOTE — Telephone Encounter (Signed)
Left ischial bursa and greater trochanteric bursa injections on 10/14/2019. Ok to repeat if helped, same problem/side, and no new injury?

## 2020-06-07 ENCOUNTER — Other Ambulatory Visit: Payer: Self-pay

## 2020-06-07 ENCOUNTER — Ambulatory Visit: Payer: Self-pay

## 2020-06-07 ENCOUNTER — Ambulatory Visit (INDEPENDENT_AMBULATORY_CARE_PROVIDER_SITE_OTHER): Payer: 59 | Admitting: Physical Medicine and Rehabilitation

## 2020-06-07 ENCOUNTER — Encounter: Payer: Self-pay | Admitting: Physical Medicine and Rehabilitation

## 2020-06-07 DIAGNOSIS — M7072 Other bursitis of hip, left hip: Secondary | ICD-10-CM | POA: Diagnosis not present

## 2020-06-07 DIAGNOSIS — M7062 Trochanteric bursitis, left hip: Secondary | ICD-10-CM

## 2020-06-07 MED ORDER — TRIAMCINOLONE ACETONIDE 40 MG/ML IJ SUSP
40.0000 mg | INTRAMUSCULAR | Status: AC | PRN
  Administered 2020-06-07: 40 mg via INTRA_ARTICULAR

## 2020-06-07 MED ORDER — BUPIVACAINE HCL 0.25 % IJ SOLN
4.0000 mL | INTRAMUSCULAR | Status: AC | PRN
Start: 2020-06-07 — End: 2020-06-07
  Administered 2020-06-07: 4 mL via INTRA_ARTICULAR

## 2020-06-07 MED ORDER — BUPIVACAINE HCL 0.25 % IJ SOLN
4.0000 mL | INTRAMUSCULAR | Status: AC | PRN
  Administered 2020-06-07: 4 mL via INTRA_ARTICULAR

## 2020-06-07 NOTE — Progress Notes (Signed)
Frank Rice - 55 y.o. male MRN [id number]  Date of birth: 07-06-1965  Office Visit Note: Visit Date: 06/07/2020 PCP: Mosetta Putt, MD Referred by: Mosetta Putt, MD  Subjective: Chief Complaint  Patient presents with  . Left Hip - Pain   HPI:  Frank Rice is a 55 y.o. male who comes in today For repeat left greater trochanteric bursa injection and left ischial bursa injection.  He has had these on several occasions really separated between 4 to 8 months at times and he says he gets almost 100% relief each time.  He has been hurting now for the last several months and it finally got to the point where it was really bothering him to the point where he needed to come in.  No new trauma no other symptoms.  He has pain over the left ischium some referral over the greater trochanter and some in the lateral thigh.  He has no pain down the leg past the knee and no paresthesia.  No new symptoms.  Since have last seen him he has undergone physical therapy for a few sessions and continues with home exercise program of strengthening and stretching.  Medications of head no real help with him that he does use some over-the-counter medications.  He does use heat and ice.  Originally referred by Dr. Mosetta Putt his primary care physician.  As noted today in the procedure note the patient is very sensitive to any type of needling through the scanner even numbing the skin and just has a significant pain response to the injection but does well.  ROS Otherwise per HPI.  Assessment & Plan: Visit Diagnoses:    ICD-10-CM   1. Greater trochanteric bursitis, left  M70.62 XR C-ARM NO REPORT    Large Joint Inj: L greater trochanter  2. Ischial bursitis of left side  M70.72 XR C-ARM NO REPORT    Large Joint Inj    Plan: No additional findings.   Meds & Orders: No orders of the defined types were placed in this encounter.   Orders Placed This Encounter  Procedures  . Large Joint Inj: L greater  trochanter  . Large Joint Inj  . XR C-ARM NO REPORT    Follow-up: Return if symptoms worsen or fail to improve.   Procedures: Large Joint Inj: L greater trochanter on 06/07/2020 8:12 AM Indications: diagnostic evaluation and pain Details: 22 G 3.5 in needle, fluoroscopy-guided anterior approach  Arthrogram: No  Medications: 4 mL bupivacaine 0.25 %; 40 mg triamcinolone acetonide 40 MG/ML Outcome: tolerated well, no immediate complications  There was excellent flow of contrast producing a partial arthrogram of the left greater trochanter. Procedure, treatment alternatives, risks and benefits explained, specific risks discussed. Consent was given by the patient. Immediately prior to procedure a time out was called to verify the correct patient, procedure, equipment, support staff and site/side marked as required. Patient was prepped and draped in the usual sterile fashion.   Large Joint Inj (Left Ischial Bursa) on 06/07/2020 8:12 AM Indications: diagnostic evaluation and pain Details: 22 G 3.5 in needle, fluoroscopy-guided anterior approach  Arthrogram: No  Medications: 4 mL bupivacaine 0.25 %; 40 mg triamcinolone acetonide 40 MG/ML Outcome: tolerated well, no immediate complications  There was excellent flow of contrast producing a partial bursal gram of the left ischial bursa.  Patient with exaggerated pain response to needling down to this position with medication delivery.  He did well afterwards however. Procedure, treatment alternatives, risks and benefits  explained, specific risks discussed. Consent was given by the patient. Immediately prior to procedure a time out was called to verify the correct patient, procedure, equipment, support staff and site/side marked as required. Patient was prepped and draped in the usual sterile fashion.          Clinical History: MR OF THE LEFT HIP WITHOUT CONTRAST  TECHNIQUE: Multiplanar, multisequence MR imaging was performed. No  intravenous contrast was administered.  COMPARISON:  None.  FINDINGS: Bones: No hip fracture, dislocation or avascular necrosis. No periosteal reaction or bone destruction. No aggressive osseous lesion.  Normal sacrum and sacroiliac joints. No SI joint widening or erosive changes.  Articular cartilage and labrum  Articular cartilage:  No chondral defect.  Labrum: Grossly intact, but evaluation is limited by lack of intraarticular fluid.  Joint or bursal effusion  Joint effusion:  No hip joint effusion.  No SI joint effusion.  Bursae:  No bursa formation.  Muscles and tendons  Flexors: Normal.  Extensors: Normal.  Abductors: Normal.  Adductors: Normal.  Gluteals: Mild tendinosis of the left gluteus medius tendon insertion with a small area of calcification adjacent to the greater trochanter which may reflect calcific tendinosis.  Hamstrings: Normal.  Other findings  Miscellaneous: No pelvic free fluid. No fluid collection or hematoma. No inguinal lymphadenopathy. No inguinal hernia.  IMPRESSION: 1. Mild tendinosis of the left gluteus medius tendon insertion with a small area of calcification adjacent to the greater trochanter which may reflect calcific tendinosis with a small partial tear. 2. No hip fracture, dislocation or avascular necrosis.   Electronically Signed   By: Elige Ko   On: 12/13/2018 14:20     Objective:  VS:  HT:    WT:   BMI:     BP:   HR: bpm  TEMP: ( )  RESP:  Physical Exam Vitals and nursing note reviewed.  Constitutional:      General: He is not in acute distress.    Appearance: Normal appearance. He is not ill-appearing.  HENT:     Head: Normocephalic and atraumatic.     Right Ear: External ear normal.     Left Ear: External ear normal.     Nose: No congestion.  Eyes:     Extraocular Movements: Extraocular movements intact.  Cardiovascular:     Rate and Rhythm: Normal rate.     Pulses:  Normal pulses.  Pulmonary:     Effort: Pulmonary effort is normal. No respiratory distress.  Abdominal:     General: There is no distension.     Palpations: Abdomen is soft.  Musculoskeletal:        General: No tenderness or signs of injury.     Cervical back: Neck supple.     Right lower leg: No edema.     Left lower leg: No edema.     Comments: Patient has good distal strength without clonus.  Concordant pain with palpation over the left ischium and on the right.  Concordant pain over the left greater trochanter and gluteus medius tendon but not the right.  Skin:    Findings: No erythema or rash.  Neurological:     General: No focal deficit present.     Mental Status: He is alert and oriented to person, place, and time.     Sensory: No sensory deficit.     Motor: No weakness or abnormal muscle tone.     Coordination: Coordination normal.  Psychiatric:        Mood  and Affect: Mood normal.        Behavior: Behavior normal.      Imaging: No results found.

## 2020-06-07 NOTE — Progress Notes (Signed)
Pt state left hip pain. Pt state walking and laying on the hip makes the pain worse. Pt state he take over the counter pain meds.  Numeric Pain Rating Scale and Functional Assessment Average Pain 5   In the last MONTH (on 0-10 scale) has pain interfered with the following?  1. General activity like being  able to carry out your everyday physical activities such as walking, climbing stairs, carrying groceries, or moving a chair?  Rating(5)   +Driver, -BT, -Dye Allergies.

## 2020-12-07 ENCOUNTER — Other Ambulatory Visit: Payer: Self-pay | Admitting: Family Medicine

## 2020-12-07 ENCOUNTER — Other Ambulatory Visit: Payer: Self-pay

## 2020-12-07 ENCOUNTER — Ambulatory Visit
Admission: RE | Admit: 2020-12-07 | Discharge: 2020-12-07 | Disposition: A | Discharge status: Home / Self Care | Payer: 59 | Source: Ambulatory Visit | Attending: Family Medicine | Admitting: Family Medicine

## 2020-12-07 DIAGNOSIS — M25511 Pain in right shoulder: Secondary | ICD-10-CM

## 2021-02-02 ENCOUNTER — Other Ambulatory Visit: Payer: Self-pay | Admitting: Physical Medicine and Rehabilitation

## 2021-02-02 DIAGNOSIS — M7062 Trochanteric bursitis, left hip: Secondary | ICD-10-CM

## 2021-02-02 DIAGNOSIS — M7072 Other bursitis of hip, left hip: Secondary | ICD-10-CM

## 2021-02-02 NOTE — Progress Notes (Signed)
Put in self referral for left ischial AND greater trochanter bursa injection. This is a repeat of last injection in march

## 2021-02-26 IMAGING — MR MR HIP*L* W/O CM
5 series · 37 of 40 positions shown · non-contrast
Comparison: None.

CLINICAL DATA: Left hip pain.  Low back pain.  Left upper leg pain.

EXAM:
MR OF THE LEFT HIP WITHOUT CONTRAST
TECHNIQUE: Multiplanar, multisequence MR imaging was performed. No intravenous
contrast was administered.

[Series 3: T1 · coronal · 4.0mm · 0.85mm/px · 8 of 24 slices shown]
[im 1/24]
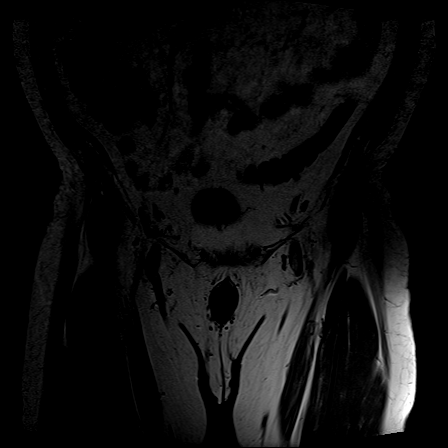
[im 4/24]
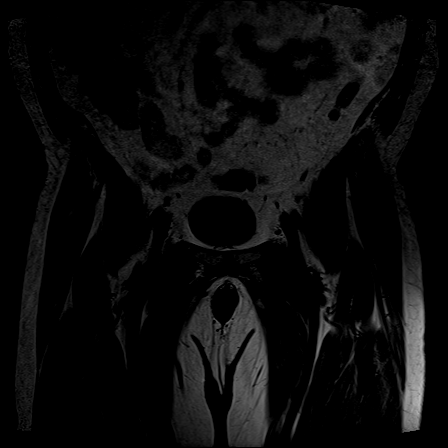
[im 7/24]
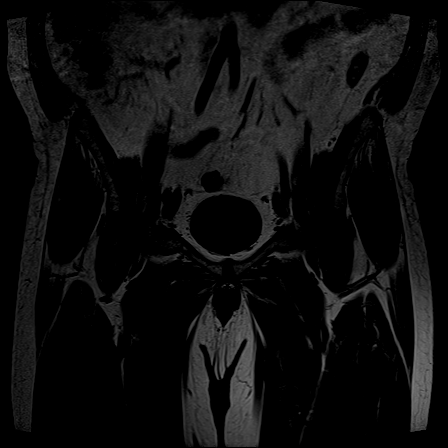
[im 10/24]
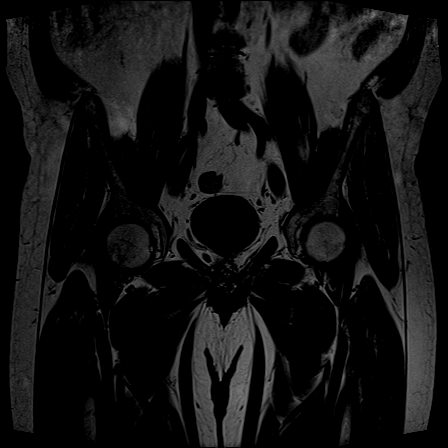
[im 14/24]
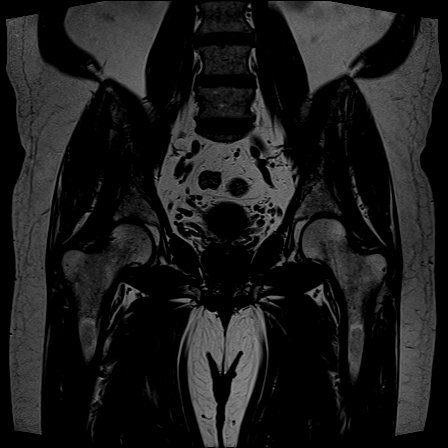
[im 17/24]
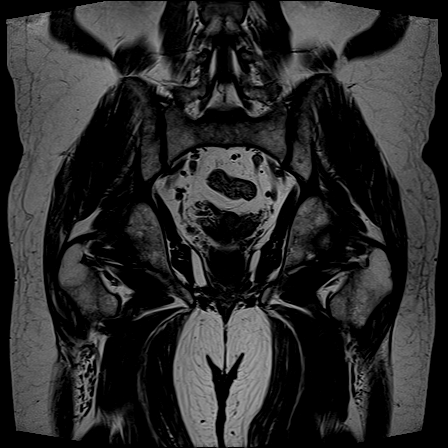
[im 20/24]
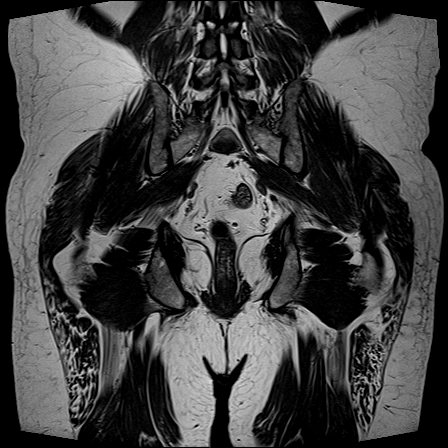
[im 24/24]
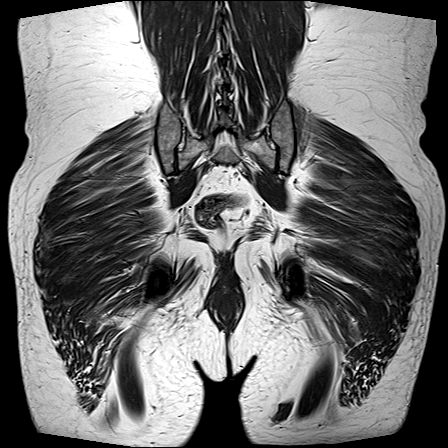

[Series 4: T2 fat-sat · coronal · 4.0mm · 1.19mm/px · 9 of 24 slices shown (1 of 2)]
[im 1/24]
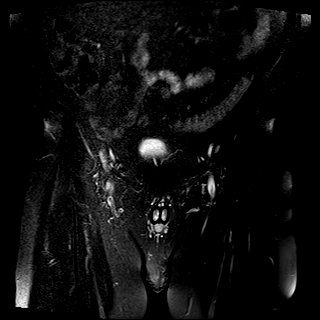
[im 3/24]
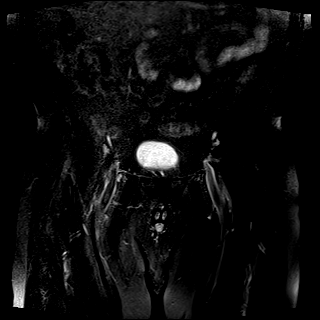
[im 6/24]
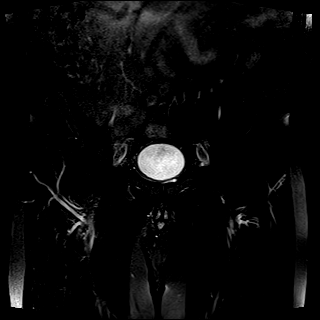
[im 9/24]
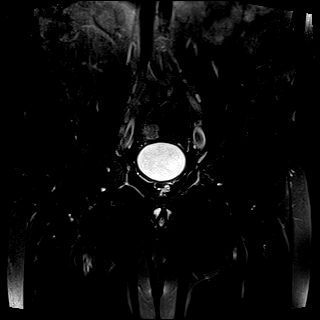
[im 12/24]
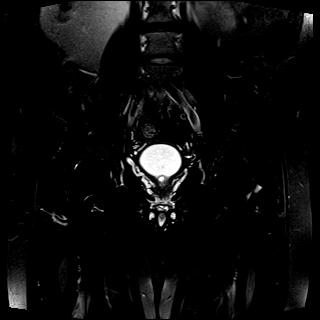
[im 15/24]
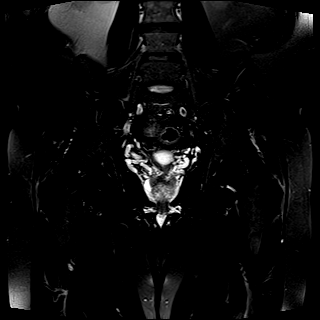
[im 18/24]
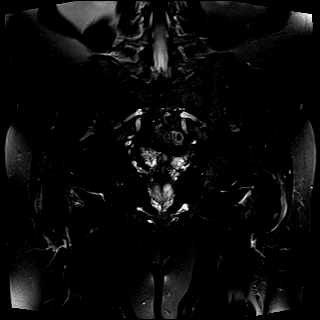
[im 21/24]
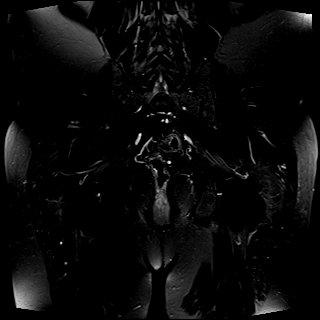
[im 24/24]
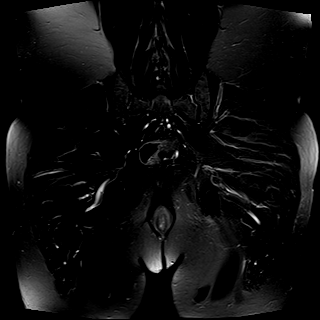

[Series 5: T2 fat-sat · axial · 4.0mm · 0.35mm/px · z∈[-26,+79]mm · 8 of 22 slices shown (2 of 2)]
[im 1/22]
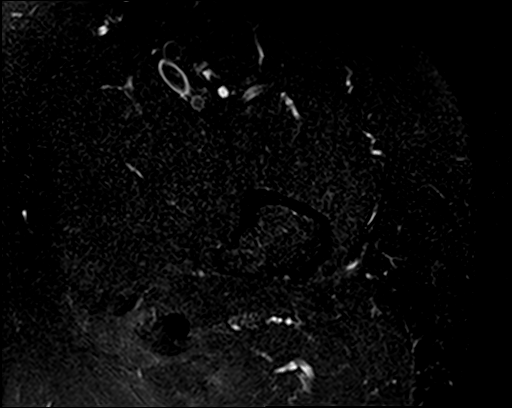
[im 4/22]
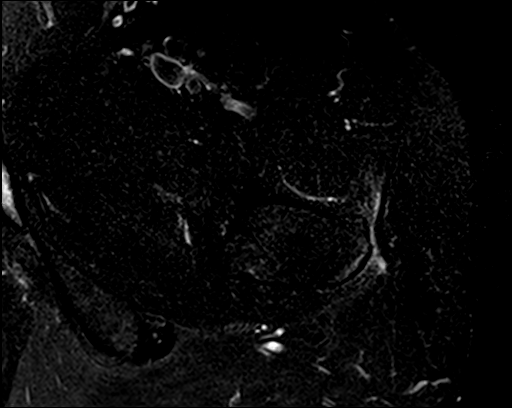
[im 7/22]
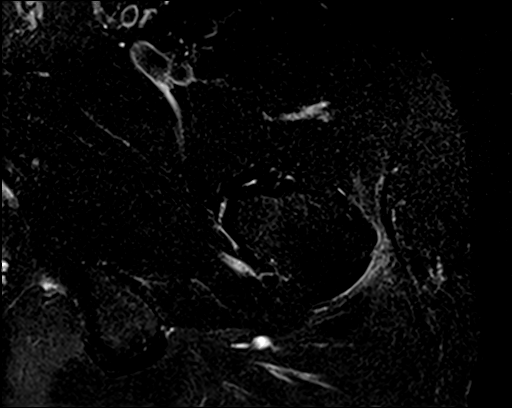
[im 10/22]
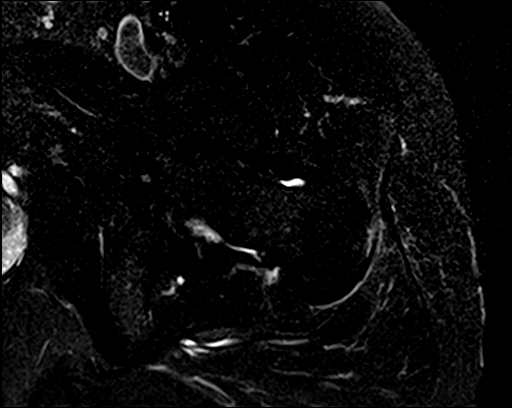
[im 13/22]
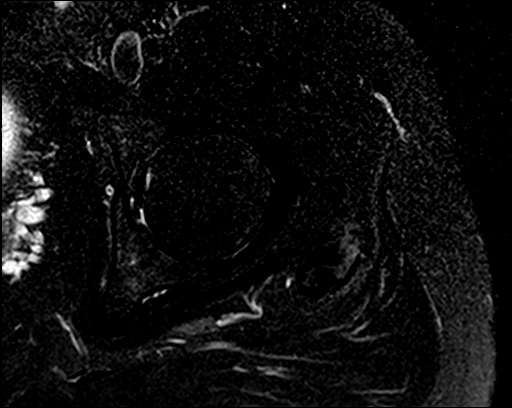
[im 16/22]
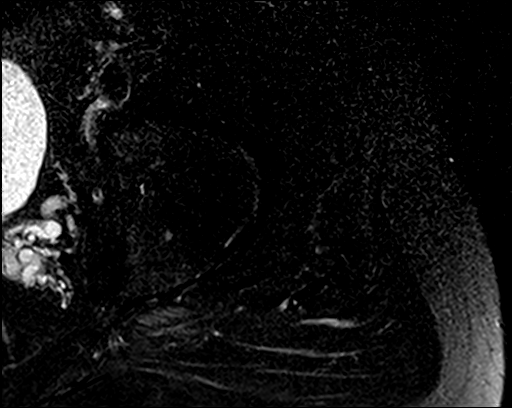
[im 19/22]
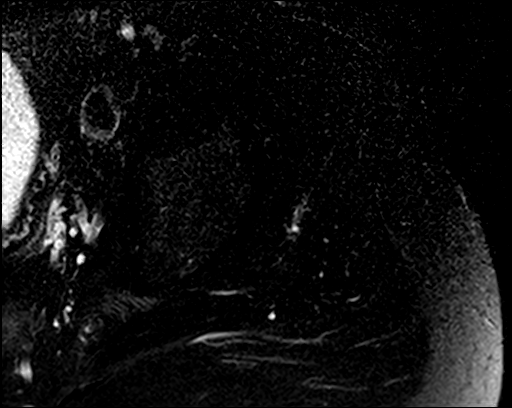
[im 22/22]
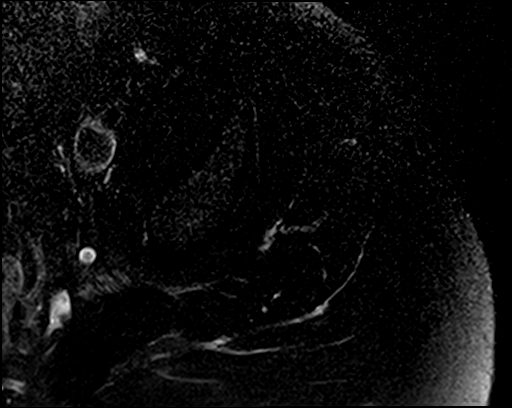

[Series 6: PD fat-sat · sagittal · 4.0mm · 0.70mm/px · 8 of 22 slices shown (1 of 2)]
[im 1/22]
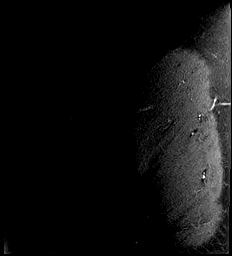
[im 4/22]
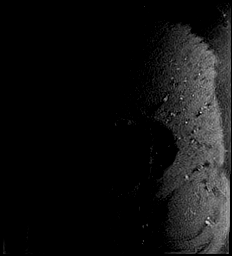
[im 7/22]
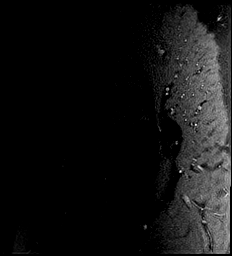
[im 10/22]
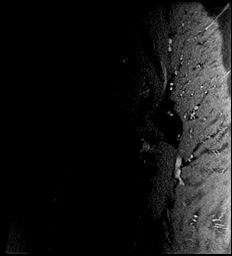
[im 13/22]
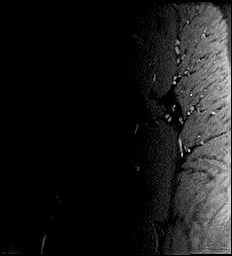
[im 16/22]
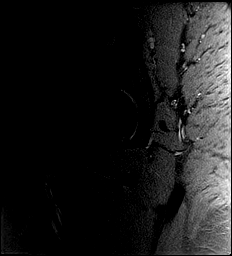
[im 19/22]
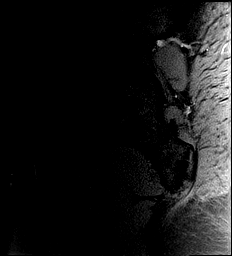
[im 22/22]
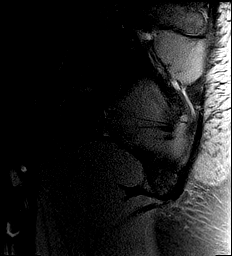

[Series 7: PD fat-sat · coronal · 4.0mm · 0.70mm/px · 4 of 19 slices shown (2 of 2)]
[im 1/19]
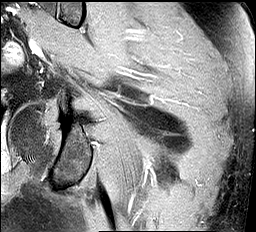
[im 4/19]
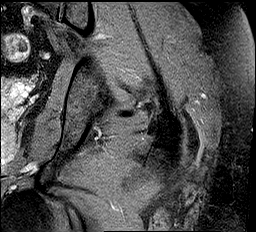
[im 7/19]
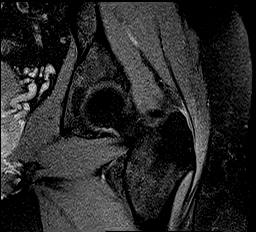
[im 10/19]
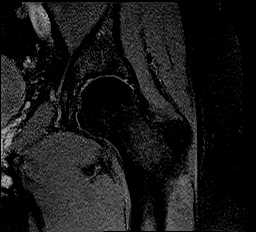

[37 of 40 positions shown; findings below may reference images not displayed]

FINDINGS: Bones: No hip fracture, dislocation or avascular necrosis. No
periosteal reaction or bone destruction. No aggressive osseous
lesion.

Normal sacrum and sacroiliac joints. No SI joint widening or erosive
changes.

Articular cartilage and labrum

Articular cartilage:  No chondral defect.

Labrum: Grossly intact, but evaluation is limited by lack of
intraarticular fluid.

Joint or bursal effusion

Joint effusion:  No hip joint effusion.  No SI joint effusion.

Bursae:  No bursa formation.

Muscles and tendons

Flexors: Normal.

Extensors: Normal.

Abductors: Normal.

Adductors: Normal.

Gluteals: Mild tendinosis of the left gluteus medius tendon
insertion with a small area of calcification adjacent to the greater
trochanter which may reflect calcific tendinosis.

Hamstrings: Normal.

Other findings

Miscellaneous: No pelvic free fluid. No fluid collection or
hematoma. No inguinal lymphadenopathy. No inguinal hernia.
IMPRESSION: 1. Mild tendinosis of the left gluteus medius tendon insertion with
a small area of calcification adjacent to the greater trochanter
which may reflect calcific tendinosis with a small partial tear.
2. No hip fracture, dislocation or avascular necrosis.

## 2021-03-09 ENCOUNTER — Encounter: Payer: Self-pay | Admitting: Physical Medicine and Rehabilitation

## 2021-03-09 ENCOUNTER — Ambulatory Visit: Payer: Self-pay

## 2021-03-09 ENCOUNTER — Ambulatory Visit: Payer: 59 | Admitting: Physical Medicine and Rehabilitation

## 2021-03-09 ENCOUNTER — Other Ambulatory Visit: Payer: Self-pay

## 2021-03-09 DIAGNOSIS — M7072 Other bursitis of hip, left hip: Secondary | ICD-10-CM | POA: Diagnosis not present

## 2021-03-09 DIAGNOSIS — M7062 Trochanteric bursitis, left hip: Secondary | ICD-10-CM | POA: Diagnosis not present

## 2021-03-09 MED ORDER — LIDOCAINE HCL 2 % IJ SOLN
4.0000 mL | INTRAMUSCULAR | Status: AC | PRN
  Administered 2021-03-09: 09:00:00 4 mL

## 2021-03-09 MED ORDER — TRIAMCINOLONE ACETONIDE 40 MG/ML IJ SUSP
60.0000 mg | INTRAMUSCULAR | Status: AC | PRN
  Administered 2021-03-09: 09:00:00 60 mg via INTRA_ARTICULAR

## 2021-03-09 MED ORDER — TRIAMCINOLONE ACETONIDE 40 MG/ML IJ SUSP
40.0000 mg | INTRAMUSCULAR | Status: AC | PRN
  Administered 2021-03-09: 09:00:00 40 mg via INTRA_ARTICULAR

## 2021-03-09 MED ORDER — BUPIVACAINE HCL 0.25 % IJ SOLN
4.0000 mL | INTRAMUSCULAR | Status: AC | PRN
  Administered 2021-03-09: 09:00:00 4 mL via INTRA_ARTICULAR

## 2021-03-09 NOTE — Progress Notes (Signed)
Pt state left hip pain. Pt state walking and sitting makes the pain worse. Pt state he take over the counter pain meds to help ease his pain.  Numeric Pain Rating Scale and Functional Assessment Average Pain 2   In the last MONTH (on 0-10 scale) has pain interfered with the following?  1. General activity like being  able to carry out your everyday physical activities such as walking, climbing stairs, carrying groceries, or moving a chair?  Rating(10)    -BT, -Dye Allergies.

## 2021-03-09 NOTE — Progress Notes (Signed)
Frank Rice - 56 y.o. male MRN [id number]  Date of birth: October 07, 1965  Office Visit Note: Visit Date: 03/09/2021 PCP: Derinda Late, MD Referred by: Derinda Late, MD  Subjective: Chief Complaint  Patient presents with   Left Hip - Pain   HPI:  Frank Rice is a 56 y.o. male who comes in todayfor repeat left greater trochanteric bursa injection and left ischial bursa injection.  He has had these on several occasions really separated between 4 to 8 months at times and he says he gets almost 100% relief each time.  He has been hurting now for the last several months and it finally got to the point where it was really bothering him to the point where he needed to come in.  No new trauma no other symptoms.  He has pain over the left ischium some referral over the greater trochanter and some in the lateral thigh.  He has no pain down the leg past the knee and no paresthesia.  No new symptoms.  Since have last seen him he continues with PT directed home exercise program of strengthening and stretching.  Medications of head no real help with him that he does use some over-the-counter medications.  He does use heat and ice.  Originally referred by Dr. Derinda Late his primary care physician.  As noted today in the procedure note the patient is very sensitive to any type of needling through the scanner even numbing the skin and just has a significant pain response to the injection but does well.  ROS Otherwise per HPI.  Assessment & Plan: Visit Diagnoses:    ICD-10-CM   1. Greater trochanteric bursitis, left  M70.62 Large Joint Inj: L greater trochanter    XR C-ARM NO REPORT    2. Ischial bursitis of left side  M70.72 Left Iscial bursa,Large Joint Inj    XR C-ARM NO REPORT      Plan: No additional findings.   Meds & Orders: No orders of the defined types were placed in this encounter.   Orders Placed This Encounter  Procedures   Large Joint Inj: L greater trochanter   Left  Iscial bursa,Large Joint Inj   XR C-ARM NO REPORT    Follow-up: Return if symptoms worsen or fail to improve.   Procedures: Large Joint Inj: L greater trochanter on 03/09/2021 9:14 AM Indications: pain and diagnostic evaluation Details: 22 G 3.5 in needle, fluoroscopy-guided lateral approach  Arthrogram: No  Medications: 4 mL lidocaine 2 %; 4 mL bupivacaine 0.25 %; 60 mg triamcinolone acetonide 40 MG/ML Outcome: tolerated well, no immediate complications  There was excellent flow of contrast outlined the greater trochanteric bursa without vascular uptake. Procedure, treatment alternatives, risks and benefits explained, specific risks discussed. Consent was given by the patient. Immediately prior to procedure a time out was called to verify the correct patient, procedure, equipment, support staff and site/side marked as required. Patient was prepped and draped in the usual sterile fashion.    Left Iscial bursa,Large Joint Inj on 03/09/2021 9:14 AM Indications: pain and diagnostic evaluation Details: 22 G 3.5 in needle, fluoroscopy-guided posterior approach  Arthrogram: No  Medications: 4 mL bupivacaine 0.25 %; 40 mg triamcinolone acetonide 40 MG/ML Outcome: tolerated well, no immediate complications  There was excellent flow of contrast outlining the ischial bursa.  There was no vascular flow noted. Procedure, treatment alternatives, risks and benefits explained, specific risks discussed. Consent was given by the patient. Immediately prior to procedure a time  out was called to verify the correct patient, procedure, equipment, support staff and site/side marked as required. Patient was prepped and draped in the usual sterile fashion.         Clinical History: MR OF THE LEFT HIP WITHOUT CONTRAST   TECHNIQUE: Multiplanar, multisequence MR imaging was performed. No intravenous contrast was administered.   COMPARISON:  None.   FINDINGS: Bones: No hip fracture, dislocation or  avascular necrosis. No periosteal reaction or bone destruction. No aggressive osseous lesion.   Normal sacrum and sacroiliac joints. No SI joint widening or erosive changes.   Articular cartilage and labrum   Articular cartilage:  No chondral defect.   Labrum: Grossly intact, but evaluation is limited by lack of intraarticular fluid.   Joint or bursal effusion   Joint effusion:  No hip joint effusion.  No SI joint effusion.   Bursae:  No bursa formation.   Muscles and tendons   Flexors: Normal.   Extensors: Normal.   Abductors: Normal.   Adductors: Normal.   Gluteals: Mild tendinosis of the left gluteus medius tendon insertion with a small area of calcification adjacent to the greater trochanter which may reflect calcific tendinosis.   Hamstrings: Normal.   Other findings   Miscellaneous: No pelvic free fluid. No fluid collection or hematoma. No inguinal lymphadenopathy. No inguinal hernia.   IMPRESSION: 1. Mild tendinosis of the left gluteus medius tendon insertion with a small area of calcification adjacent to the greater trochanter which may reflect calcific tendinosis with a small partial tear. 2. No hip fracture, dislocation or avascular necrosis.     Electronically Signed   By: Kathreen Devoid   On: 12/13/2018 14:20     Objective:  VS:  HT:     WT:    BMI:      BP:    HR: bpm   TEMP: ( )   RESP:  Physical Exam   Imaging: XR C-ARM NO REPORT  Result Date: 03/09/2021 Please see Notes tab for imaging impression.

## 2021-04-25 ENCOUNTER — Other Ambulatory Visit: Payer: Self-pay | Admitting: Family Medicine

## 2021-04-25 DIAGNOSIS — R9389 Abnormal findings on diagnostic imaging of other specified body structures: Secondary | ICD-10-CM

## 2021-05-13 ENCOUNTER — Ambulatory Visit
Admission: RE | Admit: 2021-05-13 | Discharge: 2021-05-13 | Disposition: A | Discharge status: Home / Self Care | Payer: 59 | Source: Ambulatory Visit | Attending: Family Medicine | Admitting: Family Medicine

## 2021-05-13 DIAGNOSIS — R9389 Abnormal findings on diagnostic imaging of other specified body structures: Secondary | ICD-10-CM

## 2022-02-21 ENCOUNTER — Other Ambulatory Visit: Payer: Self-pay | Admitting: Family Medicine

## 2022-02-21 ENCOUNTER — Ambulatory Visit
Admission: RE | Admit: 2022-02-21 | Discharge: 2022-02-21 | Disposition: A | Discharge status: Home / Self Care | Payer: 59 | Source: Ambulatory Visit | Attending: Family Medicine | Admitting: Family Medicine

## 2022-02-21 DIAGNOSIS — M25531 Pain in right wrist: Secondary | ICD-10-CM

## 2022-02-21 DIAGNOSIS — M25521 Pain in right elbow: Secondary | ICD-10-CM

## 2022-02-21 DIAGNOSIS — M79641 Pain in right hand: Secondary | ICD-10-CM

## 2022-02-23 IMAGING — CR DG CHEST 2V
2 series · 2 of 2 positions shown · non-contrast
Comparison: None.

CLINICAL DATA: Recent [id number] positive. Shortness of breath and
chest tightness

EXAM:
CHEST - 2 VIEW

[w chest pa]
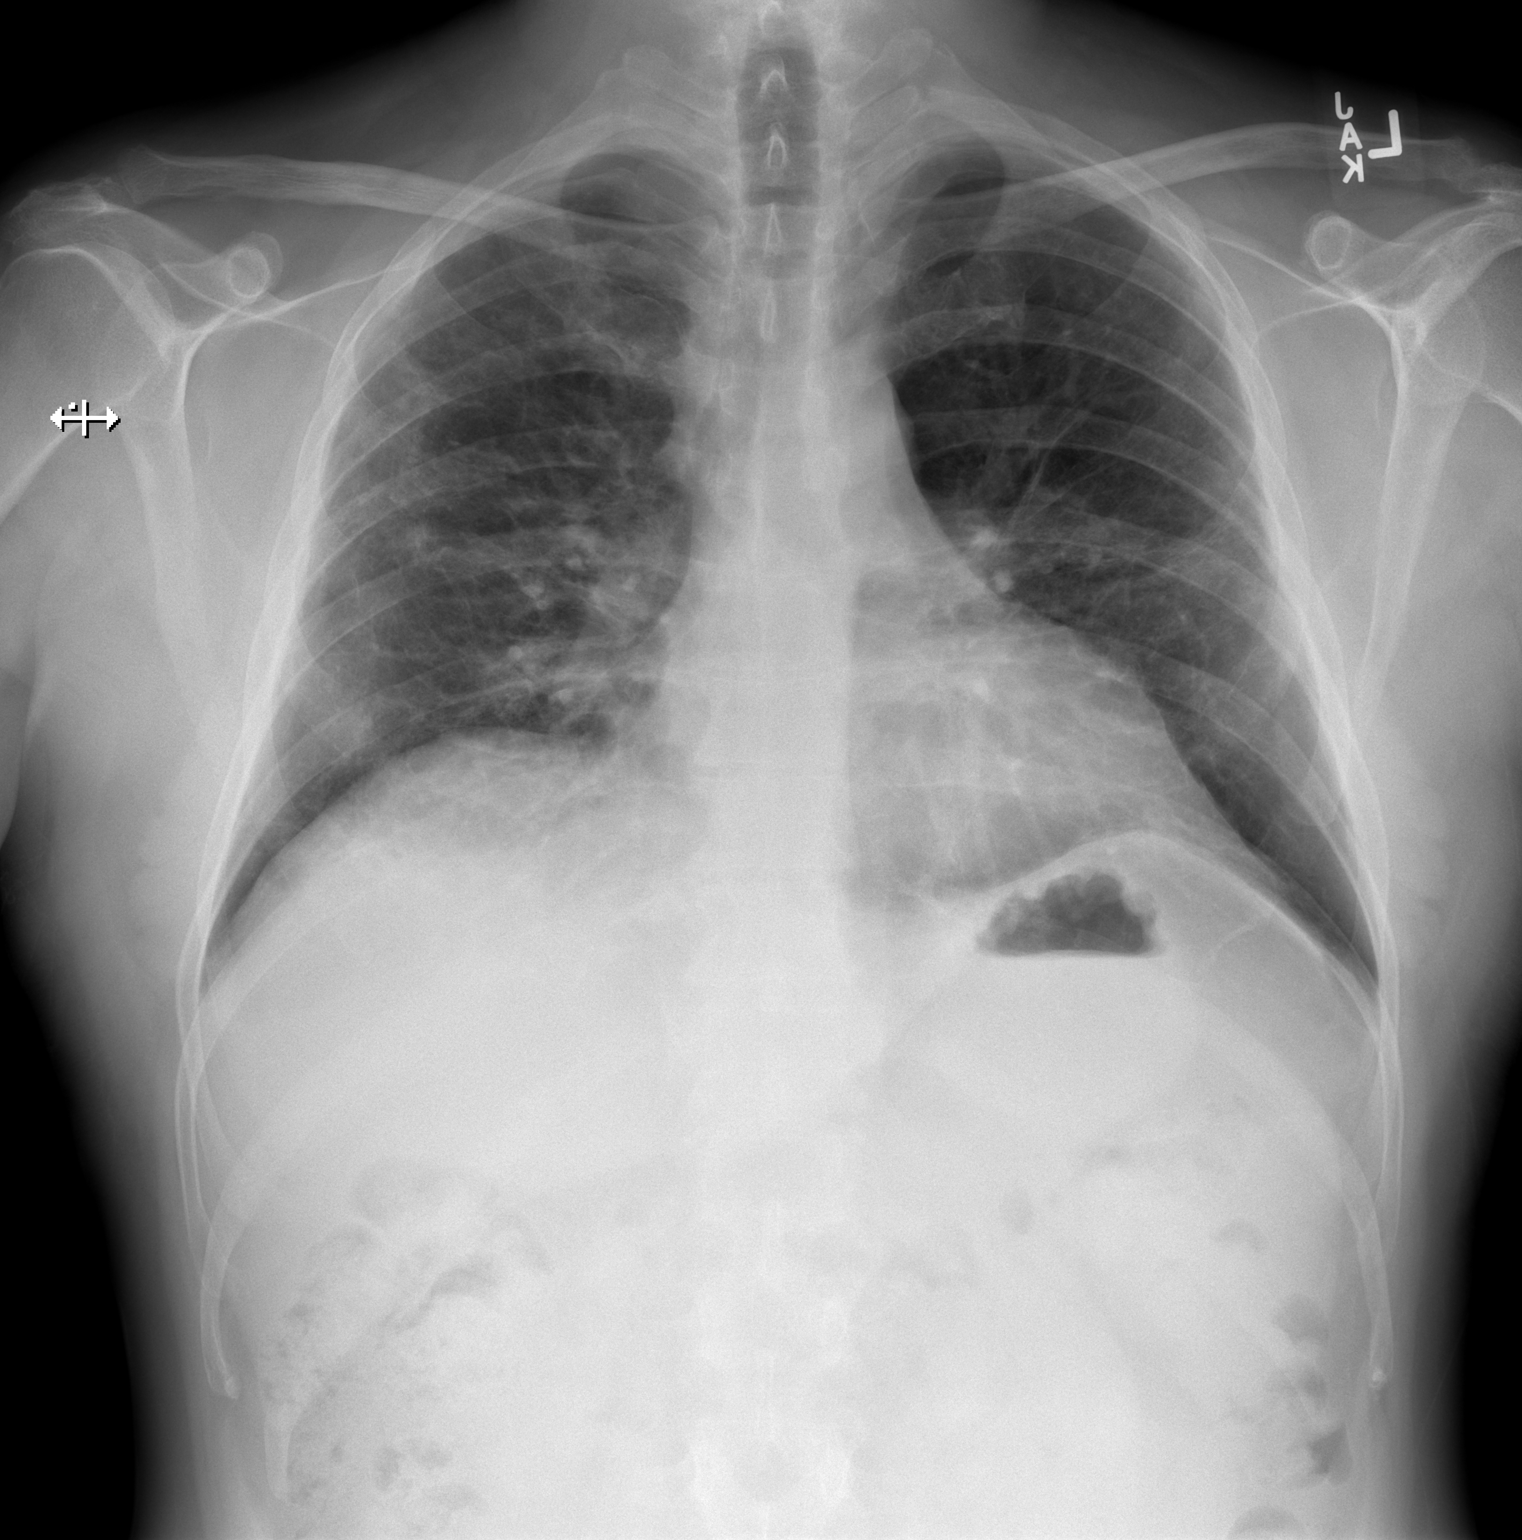

[w chest lat]
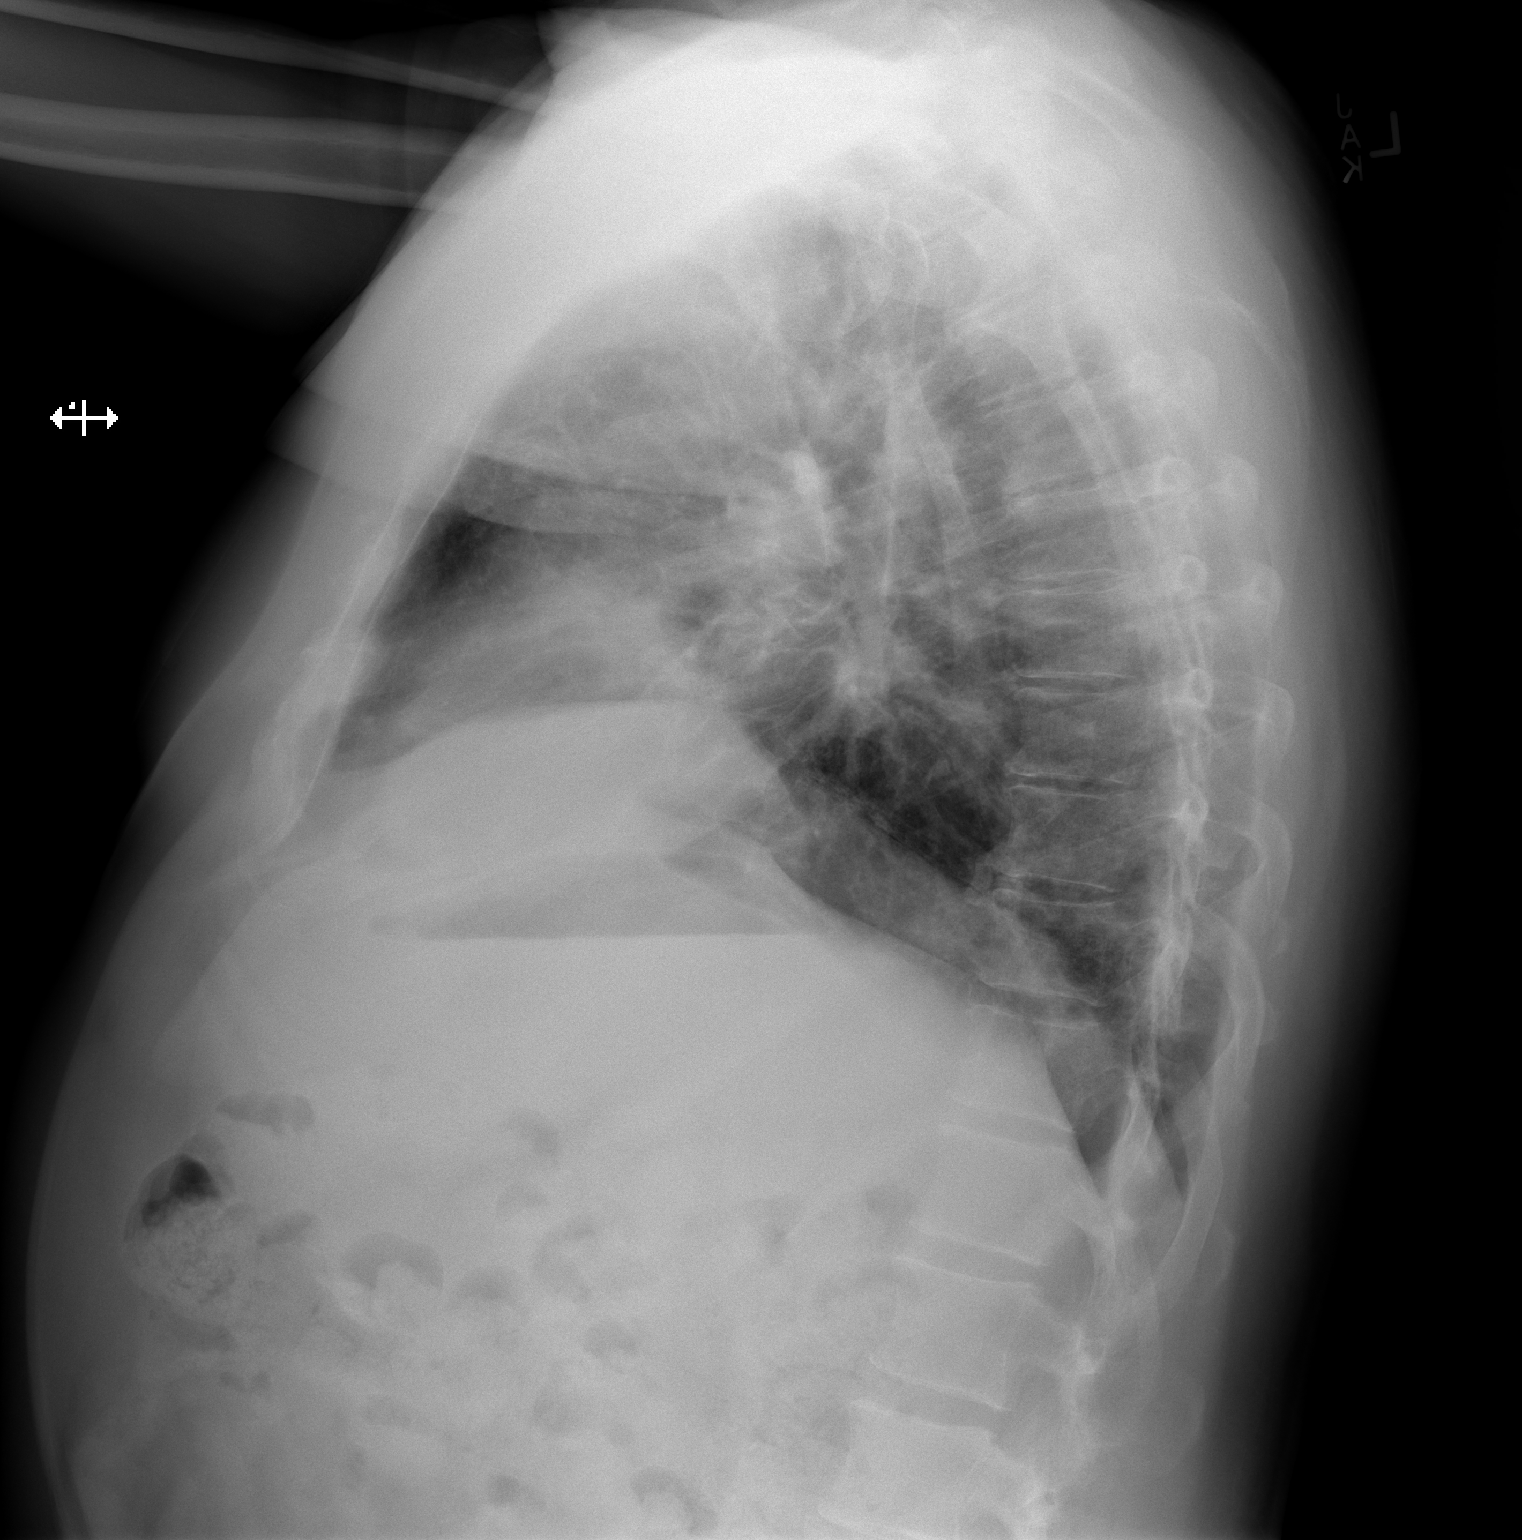

[2 of 2 positions shown; findings below may reference images not displayed]

FINDINGS: There is patchy airspace opacity in the right upper lobe as well as
in each mid lung region. No consolidation. Heart size and pulmonary
vascularity are normal. No adenopathy. No bone lesions. No
pneumothorax.
IMPRESSION: Patchy airspace opacity at several sites, indicative of atypical
organism pneumonia. No consolidation. Cardiac silhouette within
normal limits.

## 2022-04-10 ENCOUNTER — Other Ambulatory Visit: Payer: Self-pay | Admitting: Otolaryngology

## 2022-04-10 DIAGNOSIS — H918X3 Other specified hearing loss, bilateral: Secondary | ICD-10-CM

## 2022-04-21 ENCOUNTER — Ambulatory Visit
Admission: RE | Admit: 2022-04-21 | Discharge: 2022-04-21 | Disposition: A | Discharge status: Home / Self Care | Payer: BC Managed Care – PPO | Source: Ambulatory Visit | Attending: Otolaryngology | Admitting: Otolaryngology

## 2022-04-21 DIAGNOSIS — H918X3 Other specified hearing loss, bilateral: Secondary | ICD-10-CM

## 2022-04-21 MED ORDER — GADOPICLENOL 0.5 MMOL/ML IV SOLN
9.0000 mL | Freq: Once | INTRAVENOUS | Status: AC | PRN
  Administered 2022-04-21: 9 mL via INTRAVENOUS

## 2023-04-04 ENCOUNTER — Ambulatory Visit (INDEPENDENT_AMBULATORY_CARE_PROVIDER_SITE_OTHER): Payer: BC Managed Care – PPO

## 2023-09-17 ENCOUNTER — Ambulatory Visit
Admission: RE | Admit: 2023-09-17 | Discharge: 2023-09-17 | Disposition: A | Discharge status: Home / Self Care | Source: Ambulatory Visit | Attending: Family Medicine | Admitting: Family Medicine

## 2023-09-17 ENCOUNTER — Other Ambulatory Visit: Payer: Self-pay | Admitting: Family Medicine

## 2023-09-17 DIAGNOSIS — M549 Dorsalgia, unspecified: Secondary | ICD-10-CM

## 2023-09-21 ENCOUNTER — Other Ambulatory Visit: Payer: Self-pay | Admitting: Family Medicine

## 2023-09-21 DIAGNOSIS — M5416 Radiculopathy, lumbar region: Secondary | ICD-10-CM

## 2023-10-03 ENCOUNTER — Ambulatory Visit
Admission: RE | Admit: 2023-10-03 | Discharge: 2023-10-03 | Disposition: A | Discharge status: Home / Self Care | Source: Ambulatory Visit | Attending: Family Medicine | Admitting: Family Medicine

## 2023-10-03 DIAGNOSIS — M5416 Radiculopathy, lumbar region: Secondary | ICD-10-CM

## 2024-01-17 ENCOUNTER — Other Ambulatory Visit: Payer: Self-pay | Admitting: Family Medicine

## 2024-01-17 DIAGNOSIS — M5416 Radiculopathy, lumbar region: Secondary | ICD-10-CM

## 2024-02-05 ENCOUNTER — Encounter: Payer: Self-pay | Admitting: Family Medicine

## 2024-02-11 NOTE — Discharge Instructions (Signed)

## 2024-02-12 ENCOUNTER — Ambulatory Visit
Admission: RE | Admit: 2024-02-12 | Discharge: 2024-02-12 | Disposition: A | Discharge status: Home / Self Care | Source: Ambulatory Visit | Attending: Family Medicine | Admitting: Family Medicine

## 2024-02-12 DIAGNOSIS — M5416 Radiculopathy, lumbar region: Secondary | ICD-10-CM

## 2024-02-12 MED ORDER — METHYLPREDNISOLONE ACETATE 40 MG/ML INJ SUSP (RADIOLOG
80.0000 mg | Freq: Once | INTRAMUSCULAR | Status: AC
  Administered 2024-02-12: 80 mg via EPIDURAL

## 2024-02-12 MED ORDER — IOPAMIDOL (ISOVUE-M 200) INJECTION 41%
1.0000 mL | Freq: Once | INTRAMUSCULAR | Status: AC
  Administered 2024-02-12: 1 mL via EPIDURAL
